# Patient Record
Sex: Female | Born: 1935 | Race: White | Hispanic: No | Marital: Married | Smoking: Never smoker
Health system: Southern US, Community
[De-identification: ages and names within clinical notes are randomized; demographics above are authoritative.]

## PROBLEM LIST (undated history)

## (undated) DIAGNOSIS — I714 Abdominal aortic aneurysm, without rupture, unspecified: Secondary | ICD-10-CM

## (undated) DIAGNOSIS — I10 Essential (primary) hypertension: Secondary | ICD-10-CM

## (undated) DIAGNOSIS — N183 Chronic kidney disease, stage 3 unspecified: Secondary | ICD-10-CM

## (undated) DIAGNOSIS — K635 Polyp of colon: Secondary | ICD-10-CM

## (undated) DIAGNOSIS — S329XXA Fracture of unspecified parts of lumbosacral spine and pelvis, initial encounter for closed fracture: Secondary | ICD-10-CM

## (undated) DIAGNOSIS — I509 Heart failure, unspecified: Secondary | ICD-10-CM

## (undated) DIAGNOSIS — M810 Age-related osteoporosis without current pathological fracture: Secondary | ICD-10-CM

## (undated) DIAGNOSIS — D539 Nutritional anemia, unspecified: Secondary | ICD-10-CM

## (undated) DIAGNOSIS — E785 Hyperlipidemia, unspecified: Secondary | ICD-10-CM

## (undated) DIAGNOSIS — J449 Chronic obstructive pulmonary disease, unspecified: Secondary | ICD-10-CM

## (undated) DIAGNOSIS — I2510 Atherosclerotic heart disease of native coronary artery without angina pectoris: Secondary | ICD-10-CM

## (undated) DIAGNOSIS — D649 Anemia, unspecified: Secondary | ICD-10-CM

## (undated) HISTORY — DX: Nutritional anemia, unspecified: D53.9

## (undated) HISTORY — PX: APPENDECTOMY: SHX54

## (undated) HISTORY — DX: Essential (primary) hypertension: I10

## (undated) HISTORY — DX: Anemia, unspecified: D64.9

## (undated) HISTORY — DX: Atherosclerotic heart disease of native coronary artery without angina pectoris: I25.10

## (undated) HISTORY — DX: Chronic obstructive pulmonary disease, unspecified: J44.9

## (undated) HISTORY — DX: Heart failure, unspecified: I50.9

## (undated) HISTORY — DX: Chronic kidney disease, stage 3 unspecified: N18.30

## (undated) HISTORY — DX: Age-related osteoporosis without current pathological fracture: M81.0

## (undated) HISTORY — DX: Hyperlipidemia, unspecified: E78.5

## (undated) HISTORY — DX: Chronic kidney disease, stage 3 (moderate): N18.3

## (undated) HISTORY — DX: Fracture of unspecified parts of lumbosacral spine and pelvis, initial encounter for closed fracture: S32.9XXA

## (undated) HISTORY — DX: Polyp of colon: K63.5

## (undated) HISTORY — DX: Abdominal aortic aneurysm, without rupture, unspecified: I71.40

## (undated) HISTORY — DX: Abdominal aortic aneurysm, without rupture: I71.4

## (undated) HISTORY — PX: OTHER SURGICAL HISTORY: SHX169

---

## 1998-09-29 ENCOUNTER — Other Ambulatory Visit: Admission: RE | Admit: 1998-09-29 | Discharge: 1998-09-29 | Payer: Self-pay | Admitting: *Deleted

## 1999-10-09 ENCOUNTER — Other Ambulatory Visit: Admission: RE | Admit: 1999-10-09 | Discharge: 1999-10-09 | Payer: Self-pay | Admitting: *Deleted

## 1999-10-17 ENCOUNTER — Encounter: Admission: RE | Admit: 1999-10-17 | Discharge: 1999-10-17 | Payer: Self-pay | Admitting: *Deleted

## 1999-10-17 ENCOUNTER — Encounter: Payer: Self-pay | Admitting: *Deleted

## 2000-01-31 ENCOUNTER — Encounter: Payer: Self-pay | Admitting: *Deleted

## 2000-01-31 ENCOUNTER — Encounter: Admission: RE | Admit: 2000-01-31 | Discharge: 2000-01-31 | Payer: Self-pay | Admitting: *Deleted

## 2001-01-07 ENCOUNTER — Encounter: Payer: Self-pay | Admitting: Emergency Medicine

## 2001-01-07 ENCOUNTER — Inpatient Hospital Stay (HOSPITAL_COMMUNITY): Admission: EM | Admit: 2001-01-07 | Discharge: 2001-01-13 | Payer: Self-pay | Admitting: Emergency Medicine

## 2001-01-07 ENCOUNTER — Encounter: Payer: Self-pay | Admitting: Orthopedic Surgery

## 2001-01-13 ENCOUNTER — Inpatient Hospital Stay (HOSPITAL_COMMUNITY)
Admission: RE | Admit: 2001-01-13 | Discharge: 2001-01-21 | Payer: Self-pay | Admitting: Physical Medicine & Rehabilitation

## 2001-02-27 ENCOUNTER — Encounter: Admission: RE | Admit: 2001-02-27 | Discharge: 2001-02-27 | Payer: Self-pay | Admitting: Internal Medicine

## 2001-02-27 ENCOUNTER — Encounter: Payer: Self-pay | Admitting: Internal Medicine

## 2001-10-30 ENCOUNTER — Encounter: Payer: Self-pay | Admitting: Internal Medicine

## 2001-10-30 ENCOUNTER — Encounter: Admission: RE | Admit: 2001-10-30 | Discharge: 2001-10-30 | Payer: Self-pay | Admitting: Internal Medicine

## 2002-07-20 ENCOUNTER — Other Ambulatory Visit: Admission: RE | Admit: 2002-07-20 | Discharge: 2002-07-20 | Payer: Self-pay | Admitting: Internal Medicine

## 2002-11-06 ENCOUNTER — Encounter: Payer: Self-pay | Admitting: Internal Medicine

## 2002-11-06 ENCOUNTER — Encounter: Admission: RE | Admit: 2002-11-06 | Discharge: 2002-11-06 | Payer: Self-pay | Admitting: Internal Medicine

## 2002-11-23 ENCOUNTER — Encounter: Payer: Self-pay | Admitting: Internal Medicine

## 2002-11-23 ENCOUNTER — Encounter: Admission: RE | Admit: 2002-11-23 | Discharge: 2002-11-23 | Payer: Self-pay | Admitting: Internal Medicine

## 2004-04-12 ENCOUNTER — Encounter: Admission: RE | Admit: 2004-04-12 | Discharge: 2004-04-12 | Payer: Self-pay | Admitting: Internal Medicine

## 2005-03-05 ENCOUNTER — Encounter: Admission: RE | Admit: 2005-03-05 | Discharge: 2005-03-05 | Payer: Self-pay | Admitting: Orthopedic Surgery

## 2005-09-17 DIAGNOSIS — I2510 Atherosclerotic heart disease of native coronary artery without angina pectoris: Secondary | ICD-10-CM

## 2005-09-17 HISTORY — DX: Atherosclerotic heart disease of native coronary artery without angina pectoris: I25.10

## 2005-12-05 ENCOUNTER — Encounter: Admission: RE | Admit: 2005-12-05 | Discharge: 2005-12-05 | Payer: Self-pay | Admitting: Internal Medicine

## 2006-01-30 ENCOUNTER — Inpatient Hospital Stay (HOSPITAL_COMMUNITY): Admission: AD | Admit: 2006-01-30 | Discharge: 2006-02-07 | Payer: Self-pay | Admitting: Orthopedic Surgery

## 2006-01-31 ENCOUNTER — Encounter (INDEPENDENT_AMBULATORY_CARE_PROVIDER_SITE_OTHER): Payer: Self-pay | Admitting: Cardiology

## 2006-10-11 ENCOUNTER — Encounter: Admission: RE | Admit: 2006-10-11 | Discharge: 2006-10-11 | Payer: Self-pay | Admitting: *Deleted

## 2007-12-11 ENCOUNTER — Inpatient Hospital Stay (HOSPITAL_COMMUNITY): Admission: EM | Admit: 2007-12-11 | Discharge: 2007-12-23 | Payer: Self-pay | Admitting: Emergency Medicine

## 2007-12-16 ENCOUNTER — Ambulatory Visit: Payer: Self-pay | Admitting: Physical Medicine & Rehabilitation

## 2007-12-22 ENCOUNTER — Ambulatory Visit: Payer: Self-pay | Admitting: Surgery

## 2007-12-22 ENCOUNTER — Encounter (INDEPENDENT_AMBULATORY_CARE_PROVIDER_SITE_OTHER): Payer: Self-pay | Admitting: Orthopedic Surgery

## 2007-12-24 ENCOUNTER — Encounter (INDEPENDENT_AMBULATORY_CARE_PROVIDER_SITE_OTHER): Payer: Self-pay | Admitting: Emergency Medicine

## 2007-12-24 ENCOUNTER — Inpatient Hospital Stay (HOSPITAL_COMMUNITY): Admission: EM | Admit: 2007-12-24 | Discharge: 2007-12-26 | Payer: Self-pay | Admitting: Emergency Medicine

## 2010-04-23 ENCOUNTER — Emergency Department (HOSPITAL_COMMUNITY): Admission: EM | Admit: 2010-04-23 | Discharge: 2010-04-23 | Payer: Self-pay | Admitting: Emergency Medicine

## 2010-09-30 ENCOUNTER — Emergency Department (HOSPITAL_COMMUNITY)
Admission: EM | Admit: 2010-09-30 | Discharge: 2010-09-30 | Payer: Self-pay | Source: Home / Self Care | Admitting: Emergency Medicine

## 2010-10-02 LAB — DIFFERENTIAL
Basophils Absolute: 0 10*3/uL (ref 0.0–0.1)
Basophils Relative: 0 % (ref 0–1)
Eosinophils Absolute: 0 10*3/uL (ref 0.0–0.7)
Eosinophils Relative: 0 % (ref 0–5)
Lymphocytes Relative: 15 % (ref 12–46)
Lymphs Abs: 1 10*3/uL (ref 0.7–4.0)
Monocytes Absolute: 0.5 10*3/uL (ref 0.1–1.0)
Monocytes Relative: 7 % (ref 3–12)
Neutro Abs: 5.2 10*3/uL (ref 1.7–7.7)
Neutrophils Relative %: 78 % — ABNORMAL HIGH (ref 43–77)

## 2010-10-02 LAB — CBC
HCT: 37 % (ref 36.0–46.0)
Hemoglobin: 11.5 g/dL — ABNORMAL LOW (ref 12.0–15.0)
MCH: 30.4 pg (ref 26.0–34.0)
MCHC: 31.1 g/dL (ref 30.0–36.0)
MCV: 97.9 fL (ref 78.0–100.0)
Platelets: 240 10*3/uL (ref 150–400)
RBC: 3.78 MIL/uL — ABNORMAL LOW (ref 3.87–5.11)
RDW: 13.4 % (ref 11.5–15.5)
WBC: 6.7 10*3/uL (ref 4.0–10.5)

## 2010-10-02 LAB — POCT I-STAT, CHEM 8
BUN: 38 mg/dL — ABNORMAL HIGH (ref 6–23)
Calcium, Ion: 1.09 mmol/L — ABNORMAL LOW (ref 1.12–1.32)
Chloride: 102 mEq/L (ref 96–112)
Creatinine, Ser: 1.8 mg/dL — ABNORMAL HIGH (ref 0.4–1.2)
Glucose, Bld: 111 mg/dL — ABNORMAL HIGH (ref 70–99)
HCT: 36 % (ref 36.0–46.0)
Hemoglobin: 12.2 g/dL (ref 12.0–15.0)
Potassium: 4 mEq/L (ref 3.5–5.1)
Sodium: 140 mEq/L (ref 135–145)
TCO2: 34 mmol/L (ref 0–100)

## 2010-10-08 ENCOUNTER — Encounter: Payer: Self-pay | Admitting: Orthopedic Surgery

## 2010-12-01 LAB — URINE CULTURE

## 2010-12-01 LAB — URINALYSIS, ROUTINE W REFLEX MICROSCOPIC
Glucose, UA: NEGATIVE mg/dL
Nitrite: NEGATIVE
Specific Gravity, Urine: 1.013 (ref 1.005–1.030)
pH: 5.5 (ref 5.0–8.0)

## 2010-12-01 LAB — URINE MICROSCOPIC-ADD ON

## 2011-01-30 NOTE — Discharge Summary (Signed)
NAME:  HAE, AHLERS NO.:  [id number]   MEDICAL RECORD NO.:  [id number]          PATIENT TYPE:  INP   LOCATION:  5150                         FACILITY:  [facility]   PHYSICIAN:  Dyke Brackett, M.D.    DATE OF BIRTH:  1917/01/05   DATE OF ADMISSION:  12/11/2007  DATE OF DISCHARGE:  12/18/2007                               DISCHARGE SUMMARY   Mrs. Bouchillon is an 77 year old female who was admitted to the hospital on  December 11, 2007, and her discharge date to a skilled nursing facility  will be on December 18, 2007.   ADMITTING DIAGNOSES:  1. Left hip pain.  2. Left hip greater trochanter fracture.   DISCHARGE DIAGNOSES:  1. Left hip pain.  2. Left hip greater trochanter fracture, status post left hip      trochanteric nailing, postoperative day #6.   SURGICAL PROCEDURE:  Status post left hip intertrochanteric nail,  postoperative day #6.  There were no particular complications.  The  patient did have some hypokalemia following the surgery, which was  addressed by the cardiologist, Dr. Donnie Aho.   CONSULTATIONS:  The only consult was to the cardiologist, Dr. Donnie Aho,  following for her cardiac history of CHF and her hypokalemia.   HISTORY OF PRESENT ILLNESS:  Mrs. Uriegas is an 77 year old female who  came into the emergency department with left hip following a fall where  she was trying to get to the bathroom, and her daughter was next door.  Fall to the ground, was unable to get up and was having severe left hip  pain.  She was evaluated in the emergency department where they  performed an x-ray and a CT scan of her hip with a diagnosis of a  trochanter fracture on the left side.  Then it was determined per MRI  scan that she did have a greater trochanter fracture on the left side of  her hip that extended down towards the lesser trochanter.  On December 12, 2007, one day after admission, she had a left intertrochanteric hip  nailing done by Dr. Madelon Lips with assistance  from Kateri Plummer, physician  assistant.  She tolerated the procedure well, and then was followed here  in the hospital.  The first postoperative day was on December 13, 2007.  Had a cardiology consult which helped control her CHF and also continued  hypokalemia.  It was noticed on the first postoperative day hemoglobin  was 10.1, her hematocrit was 29.2, white blood cells 8.4.  Potassium was  3.9.  All other laboratory data was within normal limits, except for BUN  and creatinine of 24 and 1.53, and she has a history of previous  laboratory work of renal insufficiency.  It was then noticed on  postoperative day #2 that her hemoglobin had dropped to 8 and 25 for her  hematocrit.  Therefore, it was determined the need to transfuse 2 units  of packed red blood cells, which was okayed by cardiology.  The patient  tolerated that well and it helped stabilize her hemoglobin.  On  postoperative day #  3, it was noted her hemoglobin was up to 11.6,  hematocrit 33.4, and doing well at that point on postoperative day #3,  except somewhat slow with therapy.  Her potassium was low at 3.0 and  required a total of 60 mEq of K-Dur, and continued to be followed by the  cardiologist, Dr. Donnie Aho.  She was doing well otherwise.  Vitals within  normal limits except for difficult with rehabilitation, as she only had  been at that point transferred from bed to chair on postoperative day  #3.  On postoperative day #4, continued with rehabilitation.  It was  noted that she was able to walk 10 feet with a rolling walker with  assistance.  Continued to monitor her vitals and her hemoglobin, which  stayed within normal limits, but her potassium on postoperative day #4  was 3.8.  Still slow with therapy, and trying to continue that, as the  daughter had wished to try to go home versus skilled nursing facility.  We also added in a rehabilitation consult, which was denied, as she did  not fit the proper criteria for  rehabilitation here in the hospital.  She then continued again slowly with rehabilitation, and there was  actually no progression in walking with a rolling walker and needing  significant help, and per cardiology was determined stable here in the  hospital.  As we continued adjusting through cardiology her potassium  supplement up to 20 mEq per day, on postoperative day #5 her potassium  was 3.4.  Her hemoglobin stayed stable following the transfusion at  11.5.  Her hematocrit was 33.8.  Vitals were still all within normal  limits.  On postoperative day #6, planned for transfer to the skilled  nursing facility.  Her vitals continued to stay stable and awaiting a  bed at this time.  Plan for transfer to the skilled nursing facility.   DISCHARGE INSTRUCTIONS TO THE SKILLED NURSING FACILITY:  1. Status post left greater trochanteric hip fracture, status post      intertrochanteric nail.  Can be 50-75% weightbearing on the left      lower extremity with a rolling walker.  2. If possible, obtain a BNP in the a.m. on December 19, 2007.  Continue      to watch her potassium.  3. Plan for a daily dressing change of that left hip at the skilled      nursing facility.  4. Please notify for any fever greater than 101 or any other      complications at the skilled nursing facility, to please call [facility].  5. Please have follow up in the office at [facility] two weeks following      surgery, which will be approximately on December 26, 2007.  6. The patient will also be discharged with a regular diet.   DISCHARGE MEDICATIONS:  1. Isosorbide mononitrate 60 mg p.o. daily.  2. Potassium chloride 20 mEq p.o. daily.  3. Metoprolol 50 mg p.o. b.i.d.  4. Lasix 60 mg p.o. daily.  5. Lasix 40 mg p.o. daily.  6. Simvastatin 40 mg p.o. daily.  7. Lortab 5/325 mg one-half to one tablet p.o. q.4-6h. as needed for      pain, not to exceed 4000 mg of Tylenol in 24 hours.  8. Lovenox will be continued 30 mg injected  subcutaneous through December 27, 2007 at 8 a.m. each morning.  9. Also continue calcium carbonate and vitamin D 500 mg p.o.  daily.  10.Also continue aspirin 325 mg p.o. daily.  11.All continue Robaxin 500 mg p.o. q.6-8h. as needed for pain and      muscle spasm.  12.May also continue sublingual nitroglycerin 0.4 mg on a p.r.n. basis      q5 minutes with a total of 3 maximum.      Sharol Given, PA      Dyke Brackett, M.D.  Electronically Signed    JBS/MEDQ  D:  12/18/2007  T:  12/18/2007  Job:  [id number]

## 2011-01-30 NOTE — Discharge Summary (Signed)
NAME:  Sabrina Joyce, Sabrina Joyce                 ACCOUNT NO.:  [id number]   MEDICAL RECORD NO.:  [id number]          PATIENT TYPE:  INP   LOCATION:  4702                         FACILITY:  [facility]   PHYSICIAN:  Corinna L. Lendell Caprice, MDDATE OF BIRTH:  12-02-1916   DATE OF ADMISSION:  12/24/2007  DATE OF DISCHARGE:  12/26/2007                               DISCHARGE SUMMARY   DISCHARGE DIAGNOSES:  1. Near syncope secondary to intravascular volume depletion and      probable transient hypotension..  2. Acute on chronic renal insufficiency.  3. Dementia.  4. Recent hip fracture status post nailing  5. Hypertension.  6. Coronary artery disease with history of ischemic cardiomyopathy,      compensated.  7. Hyperlipidemia.  8. Deconditioning.   DISCHARGE MEDICATIONS:  1. Imdur should be cut to 30 mg a day.  Hold for systolic blood      pressure less than 90.  2. Metoprolol should be decreased to 25 mg p.o. b.i.d.  Hold for      systolic blood pressure less than 90.  3. I recommend holding Lasix for 3 days and then resuming at 20 mg a      day, but may need adjustment depending on symptoms, fluid      retention, etc.  4. Continue simvastatin 40 mg a day.  5. Colace 100 mg p.o. b.i.d.  6. Lovenox 30 mg subcutaneously for two more days then discontinue.  7. Calcium carbonate with vitamin D 600 mg p.o. b.i.d.  8. Aspirin 325 mg daily.  9. When furosemide is resumed, resume KCl 20 mEq a day.  10.Robaxin 500 mg p.o. q.6 h p.r.n. muscle spasms.  11.Nitroglycerin 0.4 mg sublingually as needed for chest pain.  12.Tylenol 650 mg p.o. q.4 h p.r.n. pain.   FOLLOWUP:  She will need a follow-up appointment with Dr. Madelon Lips next  week.  Please call [phone] for appointment and her staples have been  removed per or those recommendations.  She may follow up with Dr. Sandria Manly  as previously recommended by orthopedics.  Call [phone] for  appointment.   DISCHARGE INSTRUCTIONS:  1. Activity:  Continue physical  therapy, occupational therapy, 50-75%      partial weightbearing to the left leg with walker.  2. Code status:  Full code.  3. Diet:  Should be low-salt.   CONDITION:  Stable.   CONSULTATIONS:  None.   PROCEDURES:  None.   PERTINENT LABORATORY DATA:  CBC was significant for a hemoglobin of  11.6, hematocrit 34, otherwise unremarkable, D-dimer 3.13, PTT 38, INR  1.0.  On admission, her BUN was 54, creatinine was 1.9.  The day prior,  her BUN was 47 and her creatinine was 1.6.  At discharge, her BUN is 33,  creatinine 1.58, magnesium 2.6, potassium was normal.  Point care  enzymes and cardiac enzymes negative.  Urinalysis negative.  Myoglobin  normal.   SPECIAL STUDIES/RADIOLOGY:  EKG showed normal sinus rhythm with lateral  ST depression which remained unchanged.  Chest x-ray showed nothing  acute.  VQ scan was intermediate probability, but favor low end  of  intermediate range given the amount of gas trapping Dopplers of the legs  were negative.   HISTORY AND HOSPITAL COURSE:  Ms. Mars is an 77 year old white female  patient of [facility] who had been discharged the day prior  to this admission.  She had been treated prior to admission for hip  fracture and subsequent nailing.  She was transferred to [facility]  skilled nursing facility for rehab and the day after discharge had a  presyncopal episode where she was sitting down and nodded out.  She  never completely lost consciousness.  Her blood pressure was borderline.  When she arrived in the emergency room, she had normal oxygen  saturation.  She had no complaints of chest pain, no shortness of  breath.  Her blood pressure was 121/69.  On physical examination, when  the patient sat up from a supine position she had recurrence of her near  syncope, and it was felt secondary to intravascular volume depletion due  to the reproducibility of the symptoms, and the fact that her BUN and  creatinine were elevated above  her baseline.  She was admitted and given  gentle IV hydration.  Her Imdur and furosemide and metoprolol were held  and should be started back at a lower dose.  These can be up titrated as  tolerated.  At the time of discharge, she was feeling much better, was  able to ambulate with assistance to commode.  Her creatinine was  improved.  Her blood pressure was stable.  Her other medical problems  remained stable during her hospitalization.  Total time on the day of  discharge is 50 minutes.      Corinna L. Lendell Caprice, MD  Electronically Signed     CLS/MEDQ  D:  12/26/2007  T:  12/26/2007  Job:  [id number]   cc:   Dyke Brackett, M.D.  Georga Hacking, M.D.  Lavonda Jumbo, M.D.

## 2011-01-30 NOTE — Discharge Summary (Signed)
NAME:  FRANKIE, ZITO NO.:  [id number]   MEDICAL RECORD NO.:  [id number]          PATIENT TYPE:  INP   LOCATION:  5150                         FACILITY:  [facility]   PHYSICIAN:  Dyke Brackett, M.D.    DATE OF BIRTH:  Jan 16, 1917   DATE OF ADMISSION:  12/11/2007  DATE OF DISCHARGE:                               DISCHARGE SUMMARY   DISCHARGE SUMMARY ADDENDUM:  Mrs. Idris has now finally been placed at  [facility]. Plan is to discharge there.   HOSPITAL COURSE:  From post-op day #6 on went forward with no  significant changes or any significant lab work abnormalities as CBC and  BNP were continued to be followed, and all were within normal limits.  Continued to do well with some mild progression following her left  trochanteric nailing of her left greater trochanter hip fracture. Her  vitals all stayed within normal limits. Post-op day #10 she was  continued to be encouraged to walk more. Her BNP, potassium was noted to  be 3.6 within normal range as earlier she had issues with hypokalemia.  Her vitals were all within normal limits. Encouraged walking more with  PT and ended up walking a total of 15 feet with a rolling walker but  continued to need lots of queues to help her out with walking and being  partial weightbearing, that 50-75%. Also did a Doppler ultrasound of the  left upper extremity where the preliminary report said negative for DVT  or superficial thrombosis. Therefore, it is believed that the small area  of abnormality is just a hematoma under the skin in her left forearm. So  on post-op day #11 status post her left hip trochanteric nail she was  finally put in bed available at [facility] and  therefore discharged on post-op day #11. There for continued therapy due  to difficulty with daily living activities and ambulation.   DISCHARGE MEDICATIONS:  1. Isosorbide mononitrate 60 mg 1 tablet p.o. q day.  2. Metoprolol 50 mg 1 tablet p.o. b.i.d.  3. Furosemide __________ 60 mg p.o. q day.  4. Furosemide __________ 40 mg p.o. q day.  5. Simvastatin 40 mg p.o. q day.  6. Colace 100 mg p.o. b.i.d.  7. Lovenox 30 mg subcu 24 hours will be continued for a total of      another 4 days and then will discontinue the subcu Lovenox.  8. Calcium carbonate.  9. Vitamin D may be continued.  10.Aspirin 325 mg p.o. daily may be continued.  11.The patient is also getting 30 meq of K-Dur p.o. q day, continue to      help control hypokalemia.  12.Robaxin 500 mg p.o. q 6 hours as needed for spasms.  13.Nitroglycerin 0.4 mg sublingual q 5 minutes as needed for chest      pain and shortness of breath.  14.Hydrocodone 5 mg/325 mg 1/2 to 1 tablet p.o. q 4-6 hours as needed      for pain and discomfort.   ASSESSMENT AND PLAN:  On discharge, the patient is  following a left  trochanteric nailing on post-op day 11. She will continue partial  weightbearing with a walker, 50-75% at skilled nursing facility. Her  diet is regular. She will need a recheck in the office early next week  preferably on December 29, 2007. Please call for appointment. She will  require at that point a recheck. We would prefer it if possible in the  skilled nursing facility to have staples removed on post-op day 14 which  will be December 26, 2007. Please call the office at [facility] for appointment  with Dr. Madelon Lips at  [phone] for sometime during the week of December 29, 2007. Also recommend  on  discharge due to abnormal CT scan, follow up with a neurologist.  Recommend calling for appointment to review CT scan at the number [phone], Dr. Avie Echevaria, who is on call today for neurology. She needs a  followup to review CT scan.      Sharol Given, PA      Dyke Brackett, M.D.  Electronically Signed    JBS/MEDQ  D:  12/23/2007  T:  12/23/2007  Job:  [id number]

## 2011-01-30 NOTE — H&P (Signed)
NAME:  Sabrina, Joyce                 ACCOUNT NO.:  [id number]   MEDICAL RECORD NO.:  [id number]          PATIENT TYPE:  INP   LOCATION:  4702                         FACILITY:  [facility]   PHYSICIAN:  Corinna L. Lendell Caprice, MDDATE OF BIRTH:  [birth year]   DATE OF ADMISSION:  12/24/2007  DATE OF DISCHARGE:                              HISTORY & PHYSICAL   CHIEF COMPLAINT:  Almost passed out.   HISTORY OF PRESENT ILLNESS:  [name] is an 78 year old demented white  female who was brought to the emergency room from [facility] after a near-syncopal episode.  She was reportedly  sitting and apparently her head slumped over and then rolled back, and  she was moaning.  She reportedly said over and over, I'm sick.  She  cannot recall the specifics of the episode.  She has no shortness of  breath or chest pain currently, and her blood pressure was reportedly  92/44 with a CBG of 214, pulse 94, and respiratory rate 24.  Her oxygen  saturation was reportedly 77% and then increased to 88% on 2 liters  nasal cannula.  The patient was discharged from the hospital yesterday  from Dr. Candise Bowens service.  She had an intertrochanteric hip fracture  and had nailing on December 12, 2007.  She had been on Lovenox at  [facility] for DVT prophylaxis.  She has no history of DVT or  pulmonary embolus.  Her saturations have been normal as well as her  blood pressure here in the emergency room.   PAST MEDICAL HISTORY:  As above.  Also, history of myocardial infarction  with medical management, ischemic cardiomyopathy with an ejection  fraction of 40-45%, mild-to-moderate mitral valvular regurgitation,  hyperlipidemia, hypertension, and chronic renal insufficiency.   MEDICATIONS:  Reviewed and as per yesterday's discharge summary.   SOCIAL HISTORY:  Reviewed and as per previous.   FAMILY HISTORY:  Reviewed and as per previous.   REVIEW OF SYSTEMS:  Difficult due to dementia, but  otherwise negative  other than above.   PHYSICAL EXAMINATION:  VITAL SIGNS:  Temperature is 97.6, blood pressure  121/69, pulse 71, respiratory rate 18, and oxygen saturation 95% on room  air.  GENERAL:  The patient is an elderly white female in no acute distress.  HEENT:  Normocephalic, atraumatic.  Pupils equal, round, and reactive to  light.  Sclerae are nonicteric.  She has slightly dry mucous membranes.  NECK:  Supple.  No carotid bruits.  LUNGS:  Clear to auscultation bilaterally, but when the patient sat up,  she became dizzy and had to lie down immediately.  CARDIOVASCULAR:  Regular rate and rhythm with a systolic murmur in the  mitral area.  ABDOMEN:  Soft, nontender.  GU:  Deferred.  RECTAL:  Deferred.  EXTREMITIES:  No calf tenderness.  No edema.  Pulses are intact.  Denna Haggard' sign negative.  NEUROLOGIC:  The patient is alert and oriented to person and place.  Cranial nerves and sensory motor exam are grossly intact.  PSYCHIATRIC:  The patient is calm and cooperative with a normal affect.  SKIN:  No rash.   LABORATORY DATA:  CBC is significant for a hemoglobin of 11.6,  hematocrit 34, otherwise unremarkable.  PTT is 38.  D-dimer is 3.13.  Basic metabolic panel is significant for a glucose of 133.  BUN is 54,  yesterday it was 47.  Creatinine today is 1.9; yesterday, it was 1.6.  Point-of-care enzymes negative.  Urinalysis negative.  EKG shows normal  sinus rhythm with some ST depression in the lateral leads.  Chest x-ray  shows nothing acute.  VQ scan showed intermediate probability study for  the presence of pulmonary embolism; however, probability of PE is at the  lower end of intermediate range given the amount of gas trapping.   ASSESSMENT AND PLAN:  1. Near syncope.  Certainly with her hypoxia and recent surgery, PE is      a concern, and I will start Lovenox.  I will also order Dopplers of      the legs.  However, given her borderline blood pressure, her       elevated BUN and creatinine as compared to usual and the fact that      she became symptomatic when sitting up again, I suspect this was      related to intravascular volume depletion and transient      hypotension.  She will get IV fluids.  She will remain on      telemetry.  I will rule out MI.  Check another EKG in the morning.      Certainly, she has no chest pain, is not short of breath now, and      has no hypoxia.  2. Dementia.  The patient is a full code per daughter.  3. Recent hip fracture status post nailing.  4. Hypertension.  5. Coronary artery disease with history of ischemic cardiomyopathy and      congestive heart failure.  The patient's heart failure is      compensated currently.  6. Hyperlipidemia.  7. Deconditioning.  8. Acute on chronic renal insufficiency.  Her antihypertensives and      cardiac medications will be held.      Corinna L. Lendell Caprice, MD  Electronically Signed     CLS/MEDQ  D:  12/24/2007  T:  12/25/2007  Job:  [id number]   cc:   Lavonda Jumbo, M.D.  Georga Hacking, M.D.  Dyke Brackett, M.D.

## 2011-01-30 NOTE — Op Note (Signed)
NAME:  Sabrina Joyce, Sabrina Joyce                 ACCOUNT NO.:  [id number]   MEDICAL RECORD NO.:  [id number]          PATIENT TYPE:  INP   LOCATION:  5150                         FACILITY:  [facility]   PHYSICIAN:  Dyke Brackett, M.D.    DATE OF BIRTH:  05/18/1917   DATE OF PROCEDURE:  12/12/2007  DATE OF DISCHARGE:                               OPERATIVE REPORT   PREOPERATIVE DIAGNOSIS:  Left nondisplaced intertrochanteric hip  fracture.   POSTOPERATIVE DIAGNOSIS:  Left nondisplaced intertrochanteric hip  fracture.   OPERATION:  Left intertrochanteric hip nailing (DePuy Ace 125 degrees  180 x 9 mm trochanteric nail with 95 x 10.5 mm lag screw).   SURGEON:  Caffrey.   ASSISTANT:  Governor Specking, PA.   BLOOD LOSS:  Approximately 150.   PROCEDURE:  Sterile prep and drape with the fracture table leg slightly  adducted in the supine position.  C-arm showed an entry point just  superior to the trochanter where a guide pinned was placed without  difficulty, overreamed with the appropriate reamer.  This was followed  by insertion of the nail through the greater trochanter area with the  appropriate lag screw length as well as head shaft angle, confirmed to  be in good position without any screws in the AP and lateral plane,  followed by insertion of the lag screw.  No compression needed to the  fact it was nondisplaced at least in the intertrochanteric area, and 2  distal lag screw placed confirmed on x-ray to be with anatomic  reduction, as well as placement of the screws.  The proximal incision  which was through a gluteus maximus split was closed with running  interrupted Vicryl skin clips and skin clips, and then the insertion  site for the compression screw was closed with Vicryl and staples, in  staples on the femoral screws side.  The wounds were irrigated.  Marcaine without epinephrine was instilled in the skin.  A lightly  compressive sterile dressing applied.  Taken to recovery in stable  addition.      Dyke Brackett, M.D.  Electronically Signed     WDC/MEDQ  D:  12/12/2007  T:  12/13/2007  Job:  [id number]

## 2011-02-02 NOTE — Consult Note (Signed)
[place]. [facility]  Patient:    Sabrina Joyce, Sabrina Joyce                        MRN: [id number] Proc. Date: 01/07/01 Adm. Date:  [id number] Attending:  Twana First CC:         Darius Bump, M.D.  Robert A. Thurston Hole, M.D.   Consultation Report  REASON FOR CONSULTATION:  Thank you for asking me to see this 77 year old female for cardiologic opinion regarding preoperative evaluation. She has a longstanding history of essential hypertension and also has hyperlipidemia under treatment. She has been very active physically, able to do yard work and has really been asymptomatic. The patient does note some mild dyspnea walking up hills or stairs, but this is nothing new according to her. She specifically denies chest pain suggestive of angina, tightness, pressure, heaviness, PND, orthopnea, TIAs or claudication.  She sustained a hip fracture when she had a cramp in her leg last evening and was walking around and lost her balance and fell. There was no syncope.  PAST MEDICAL HISTORY: 1. Hypertension for many years. 2. Hyperlipidemia for years. 3. History of peripheral vascular disease with bruit and an    abdominal aortic aneurysm.  PAST SURGICAL HISTORY: 1. Appendectomy. 2. Cataract extraction.  CURRENT MEDICATIONS: 1. Toprol XL 50 mg daily. 2. Zocor 10 mg daily. 3. K-Dur 20 mEq daily. 4. Norvasc 5 mg daily. 5. Hydrochlorothiazide 25 mg daily. 6. Aspirin daily.  ALLERGIES:  No known drug allergies.  FAMILY HISTORY:  Positive for heart disease. Father died at age 51 of MI and brother died at age 20 of colon cancer, a brother died at age 78 of uncertain cause, a brother died of peripheral vascular disease and a sister died of renal failure.  SOCIAL HISTORY:  She is married. Her husband had bypass grafting approximately 6 weeks ago. She has a daughter who is in town with her. She does not use alcohol or tobacco products.  PHYSICAL  EXAMINATION:  GENERAL:  She is a pleasant elderly woman who is laying in bed in traction.  VITAL SIGNS:  Blood pressure elevated at 160/105, pulse is currently 100.  SKIN:  Warm and dry.  HEENT:  There are no carotid bruit noted. There is no JVD noted. Pupils equal, round and reactive to light and accommodation bilaterally. Fundi was not examined.  LYMPHATICS:  Lymph nodes unremarkable.  LUNGS:  Clear.  CARDIOVASCULAR:  Normal S1 and S2. There was no S3 and no murmur.  ABDOMEN:  Soft and nontender. There is no aneurysm palpable. There are bilateral femoral bruit present.  EXTREMITIES:  Dorsalis pedis pulses are 2+. There is no significant edema noted. Her right leg is immobilized.  NEUROLOGIC:  Grossly normal.  LABORATORY AND ACCESSORY DATA:  12-Lead ECG shows ST depression in inferolateral leads, new according to previous ECG.  Lab studies show a BUN of 26.0, creatinine 1.3, potassium 3.4. Hemoglobin 12.3, hematocrit 36.2, glucose 149.  IMPRESSION: 1. Hip fracture in need of surgery. I believe you may proceed from a    cardiovascular view point. 2. Abnormal electrocardiogram. Differential possibility would include    due to hypertension, hypokalemia or ischemia. She has no clinical    evidence of ischemia. 3. Longstanding hypertensive heart disease. 4. Hyperlipidemia under treatment. 5. Peripheral vascular disease with an abdominal aortic aneurysm and    bilateral femoral bruit.  RECOMMENDATIONS:  You may proceed with surgery from a cardiovascular  viewpoint. I would try to replete her potassium to get it above 4.0 and do EKG monitoring following surgery. I would go ahead and cover her with beta blockers during the time of surgery like she has been on before.  I appreciate seeing this nice woman with you.  Sincerely yours. DD:  01/07/01 TD:  01/07/01 Job: [id number]

## 2011-02-02 NOTE — Discharge Summary (Signed)
NAMEBLAKELYNN, SCHEELER                 ACCOUNT NO.:  [id number]   MEDICAL RECORD NO.:  [id number]          PATIENT TYPE:  INP   LOCATION:  4728                         FACILITY:  [facility]   PHYSICIAN:  Hollice Espy, M.D.DATE OF BIRTH:  12-01-1916   DATE OF ADMISSION:  01/30/2006  DATE OF DISCHARGE:  02/07/2006                                 DISCHARGE SUMMARY   ADDENDUM:   ATTENDING PHYSICIAN:  Osvaldo Shipper, M.D.   CONSULTANTSGeorga Hacking, M.D., cardiology.   ADDENDUM:  Please see previous discharge summary by Corinna L. Lendell Caprice,  M.D., from May 16-21, 2007.  Additional addendum is as follows:   ADDITIONAL DIAGNOSES:  1.  Nonsustained episodes of ventricular tachycardia.  2.  Renal insufficiency, baseline creatinine of 1.1.   DISCHARGE MEDICATIONS FOR THIS PATIENT:  1.  Zocor 10 mg p.o. q.h.s.  2.  Imdur 30 mg p.o. daily.  3.  Colace 100 mg p.o. b.i.d.  4.  K-Dur 20 mEq p.o. daily.  5.  Dulcolax 10 mg p.o. daily.  6.  Lisinopril 10 mg p.o. b.i.d.  7.  Metoprolol 50 mg p.o. b.i.d.  8.  Aspirin 81 mg p.o. daily.  9.  Lasix 40 mg p.o. daily.  10. Plavix 75 mg p.o. daily.   [facility] HOSPITAL COURSE FROM MAY 21-24, 2007:  The patient remained well.  She was complaining of some weakness.  She had episodes of nonsustained  ventricular tachycardia on the night of May 21 and again on May 22.  In  discussion with Dr. Donnie Aho of cardiology, who had been following the  patient, given patient's advanced age, some CHF left ventricular function,  Dr. Donnie Aho also recommended giving patient potassium intact, following her  BNP, adding Plavix, and in discussion with the family and the patient, they  are opposed to a CABG and actually would not plan for invasive treatment on  this patient and recommended instead medical management only.  Given  these findings, I concur.  Plan will be, barring any other further episodes  or occurrences, the patient will be discharged to [facility]  on Feb 07, 2006.  The patient's overall disposition is improved.  She will  be discharged on a heart-healthy diet.      Hollice Espy, M.D.  Electronically Signed     SKK/MEDQ  D:  02/06/2006  T:  02/06/2006  Job:  [id number]

## 2011-02-02 NOTE — Discharge Summary (Signed)
NAME:  BESAN, KETCHEM                 ACCOUNT NO.:  [id number]   MEDICAL RECORD NO.:  [id number]          PATIENT TYPE:  INP   LOCATION:  4728                         FACILITY:  [facility]   PHYSICIAN:  Corinna L. Lendell Caprice, MDDATE OF BIRTH:  1917-06-22   DATE OF ADMISSION:  01/30/2006  DATE OF DISCHARGE:                                 DISCHARGE SUMMARY   This is a preliminary discharge summary on Ms. Lich.  An addendum will  need to be dictated.   DISCHARGE DIAGNOSES:  1.  Acute non-Q-wave myocardial infarction.  2.  Congestive heart failure, ejection fraction 40-45% by echocardiogram.  3.  Mild to moderate mitral valvular regurgitation.  4.  Status post fall with sacral and alar fracture right ileopubic eminence      and right lateral inferior pubic ramus fracture and left medial superior      pubic ramus fractures and partial thickness tear of the left hamstring      at the ischial tuberosity insertion.  5.  Hypertension.  6.  Hyperlipidemia.  7.  History of right hip fracture.  8.  Probable mild dementia.   MEDICATIONS:  Per discharging physician.   CONSULTATIONS:  Cardiology.   PROCEDURES:  None.   CONDITION:  Stable.   ACTIVITY:  As tolerated.   PERTINENT LABORATORIES:  TSH 3.199, LDL 54, HDL 55, total cholesterol 161,  triglycerides 69.  BNP on admission was 1812, on May 21 was 1200.  Peak  troponin was 11.3.  Peak CPK was 393.  Peak MB fraction was 42 with a  relative index of 10.  Basic metabolic panel on admission was unremarkable.  ABG on room air:  Her pH was 7.44, pCO234, pO2 62.  D-dimer 1.12.  EKG  showed normal sinus rhythm with marked ST depressions laterally.  Chest x-  ray showed cardiac enlargement without acute pulmonary process.   HISTORY AND HOSPITAL COURSE:  Ms. Kirstein is an 77 year old white female who  was originally admitted to Dr. Candise Bowens service for hip fractures.  She had  fallen several weeks prior to admission and had negative plain films.   She  did have an eventual outpatient MRI which showed the above fractures.  Her  primary care physician is Dr. Daphine Deutscher.  Apparently en route to the hospital  in the ambulance she was hypoxic down into the 80s and she was complaining  of shortness of breath and orthopnea.  She denied any chest pain.  I was  consulted for the dyspnea.  An EKG did show lateral ischemia and she ruled  in for myocardial infarction by enzyme.  Cardiology was consulted and felt  that medical management would be most appropriate in this situation.  The  patient had no rales on exam.  She was alert and oriented.  Please see H&P  for complete details.  She was given Lasix, aspirin, Lovenox, beta blocker.  She has been started on an ACE inhibitor as well.  Her shortness of breath  improved.  She had no arrhythmia.  She had several days of bed rest and at  present may  be discharged in a day or two if stable.   She has had physical therapy and at this time disposition is pending.  She  had been living at home and I suspect skilled nursing facility is most  prudent unless she can have 24-hour supervision.  If she does end up going  home, she will need home physical therapy.   During her hospitalization she did have some constipation and will need a  good bowel regimen as an outpatient.   She also had some sundowning but was not agitated and did not require any  sedation to date.   I did discuss code status with the daughter who said she would discuss with  her mother but as of yet no decisions have been made.  She has no Advanced  Directive.  Further followup, disposition, and medications to be dictated by  discharging physician.      Corinna L. Lendell Caprice, MD  Electronically Signed     CLS/MEDQ  D:  02/04/2006  T:  02/04/2006  Job:  [id number]

## 2011-02-02 NOTE — Consult Note (Signed)
NAME:  Sabrina Joyce, Sabrina Joyce                 ACCOUNT NO.:  [id number]   MEDICAL RECORD NO.:  [id number]          PATIENT TYPE:  INP   LOCATION:  4728                         FACILITY:  [facility]   PHYSICIAN:  Corinna L. Lendell Caprice, MDDATE OF BIRTH:  [birth year]   DATE OF CONSULTATION:  DATE OF DISCHARGE:                                   CONSULTATION   REQUESTING PHYSICIAN:  Dr. Madelon Lips   REASON FOR CONSULTATION:  Dyspnea.   Please note that I examined and interviewed the patient yesterday but no  tests were back, so much of the events have occurred overnight.   IMPRESSIONS/RECOMMENDATIONS:  1.  Acute myocardial infarction, non-Q-wave, by troponin and cardiac      enzymes:  I recommend Lovenox, aspirin, oxygen, beta blocker,      echocardiogram.  Dr. Verdis Prime has been consulted by my partner, Dr.      Rito Ehrlich, overnight.  [facility] is to see the patient this      morning.  For now I will hold on the anticoagulant and antiplatelet      medications in case she goes to cardiac catheterization.  I will,      however, give beta blocker.  They may do a Myoview but I doubt she can      undergo stress Myoview, even chemical, due to the positive enzymes.  I      will, however, discuss further with cardiology.  A repeat EKG is      pending.  I will also check a TSH to rule out hyperthyroid state.  The      patient can be transferred to my service.  2.  Dyspnea, reported hypoxia:  The patient reports having had orthopnea      over the past week.  She has no sign of congestive heart failure on exam      or chest x-ray, but she may very well have an element of heart failure      and I will check a BNP in addition to the echocardiogram.  She also has      a slightly elevated D-dimer and is at risk for pulmonary embolus.  She      may also need a CT angiogram of the chest.  I will hold on this for now,      however.  3.  Sacral and alar fracture, right ileopubic imminence, right lateral  inferior pubic ramus and left medial superior pubic ramus fractures.  4.  Partial thickness tear of the left hamstring at the ischial tuberosity      insertion:  For now the patient will be bedrest but further      recommendations per orthopedics once the patient's medical status is      stabilized.  5.  History of hypertension.  6.  History of hyperlipidemia.  7.  History of right hip fracture.  8.  Appendectomy.   HISTORY OF PRESENT ILLNESS:  Ms. Battey is a pleasant 77 year old white  female patient who was admitted to Dr. Candise Bowens service with pelvic  fractures.  She reportedly had desaturations in  the ambulance on the way to  the hospital into the 80s and was complaining of shortness of breath.  She  denies any chest pain.  Her oxygen saturation is now normal.  She does  report orthopnea, worsening over the past few days.  She denies any calf  pain.  She has chronic leg swelling which is actually improved since she has  been on bedrest.  She fell about a month ago and apparently has had plain  films which showed no fracture but had an MRI as an outpatient which did  show pelvic fractures as above, and she was admitted for this problem.  I  was asked to assist with the hypoxia and dyspnea.  I saw the patient  yesterday about half an hour after the consult.  It was about 6:30 when I  saw the patient.  She had none of her tests back yet, but overnight she has  ruled in for myocardial infarction and the above events took place.  She has  no previous history of coronary artery disease or other cardiac issues that  she knows of.  She has no history of thromboembolus.  She has been  immobilized due to this recent fall and resulting pelvic fractures.   PAST MEDICAL HISTORY:  As above.   MEDICATIONS AT HOME:  1.  She takes Vicodin as needed.  2.  Simvastatin 10 mg a day.  3.  Aspirin 325 mg a day.  4.  Norvasc 5 mg a day.   She reports an intolerance to CODEINE.   SOCIAL HISTORY:   She has no smoking or drinking history.  She is here with  her daughter.   FAMILY HISTORY:  Noncontributory.   REVIEW OF SYSTEMS:  As above, otherwise negative.   PHYSICAL EXAMINATION:  VITAL SIGNS:  Her temperature is 97, pulse 80,  respiratory rate 16, blood pressure 125/72, oxygen saturation 98% on 2 L  nasal cannula oxygen.  GENERAL:  The patient is a thin, elderly white female in no acute distress.  HEENT:  Normocephalic, atraumatic.  Pupils equal, round, reactive to light.  Sclera nonicteric.  Moist mucous membranes.  NECK:  Supple.  No JVD, no thyromegaly.  LUNGS:  Clear to auscultation bilaterally without wheezes, rhonchi or rales.  CARDIOVASCULAR:  Quiet heart tones.  No murmurs, gallops or rubs  appreciated.  ABDOMEN:  Soft, nontender, nondistended.  GENITOURINARY AND RECTAL:  Deferred.  EXTREMITIES:  She has wrinkled skin around her legs and ankles consistent  with decreased edema.  She has no calf tenderness.  No cords.  Homans sign  negative.  SKIN:  No rash.  PSYCHIATRIC:  Normal affect.  NEUROLOGIC:  The patient is alert and oriented.  Cranial nerves and  sensorimotor exam are grossly intact.   LABORATORY:  Her ABG on room air shows pH of 7.44, pCO2 of 34, pO2 of 62,  bicarbonate 23, base deficit 0.5, oxygen saturation 91.9%.  D-dimer 1.12.  BMET unremarkable.  Initial CPK was 398, MB fraction was 42, index was 10.  Initial troponin was 8.85 and at 2:00 a.m. is 11.3.  EKG done yesterday  shows lateral ischemia.  She has an old EKG which I have found since  yesterday and on January 07, 2001, had actually worsening of the inferolateral  ischemia and ST depression.  The subsequent day she had normalization of  those changes.  Chest x-ray shows nothing acute.   ASSESSMENT/PLAN:  As above.      Corinna L. Lendell Caprice,  MD  Electronically Signed     CLS/MEDQ  D:  01/31/2006  T:  01/31/2006  Job:  [id number]  cc:   Darius Bump, M.D.  Fax: [phone]   Dyke Brackett, M.D.  Fax: [phone]

## 2011-02-02 NOTE — Consult Note (Signed)
Sabrina Joyce, Sabrina Joyce NO.:  [id number]   MEDICAL RECORD NO.:  [id number]          PATIENT TYPE:  INP   LOCATION:  4728                         FACILITY:  [facility]   PHYSICIAN:  Lyn Records, M.D.   DATE OF BIRTH:  1916/12/26   DATE OF CONSULTATION:  01/31/2006  DATE OF DISCHARGE:                                   CONSULTATION   REASON FOR SERVICE:  Abnormal cardiac enzymes and abnormal EKG.   CONCLUSIONS:  1.  Acute coronary syndrome/non ST elevations myocardial infarction, age      undetermined but fresh within the last 48 hours.  2.  Prior history of hypertension.  3.  Abdominal aortic aneurysm small when last evaluated in 2001.  4.  Altered mental status/early dementia.  5.  Pelvic fractures.   RECOMMENDATIONS:  1.  Empiric therapy with Imdur/long-acting nitrates.  2.  Start beta blocker therapy in the form of Toprol-XL 25 mg per day.  3.  Sublingual nitroglycerin if recurrent chest discomfort.  4.  2-D echocardiogram to assess left ventricular function.  5.  Further evaluation where the invasive investigation or not after      discussion with family.  6.  Obvious continuation of aspirin therapy and Lovenox for the time being.   COMMENTS:  The patient is 76 and was admitted to the hospital for pain  control after being documented to have pelvic fractures by the ortho  service.  She complained of having some shortness of breath and had cardiac  enzymes evaluated.  These were elevated.  An EKG was done and this  demonstrated anterolateral T wave inversion.  The patient has no known prior  cardiac history but did have similar EKG changes prior to hip fracture  surgery in 2001 and was seen in consultation at that time by Dr. Donnie Aho.  She was cleared for surgery and apparently did well with the surgery from a  cardiac standpoint.   ALLERGIES:  CODEINE.   CURRENT MEDICATIONS:  Norvasc, coated aspirin, Zocor, Lovenox.   HABITS:  Denies ethanol and tobacco  use   SIGNIFICANT PAST MEDICAL HISTORY:  Outlined above.   FAMILY HISTORY:  Noncontributory.   PHYSICAL EXAMINATION:  GENERAL:  The patient is lying flat in bed.  She does  not complain of dyspnea.  VITAL SIGNS:  Blood pressure 120/72, heart rate __________.  NECK:  Neck veins are distended.  Left carotid bruit is heard.  CARDIAC:  Scratchy 1-2/6 systolic murmur, right upper sternal border.  No  diastolic murmurs heard.  LUNGS:  Clear.  ABDOMEN:  Soft.  Bowel sounds normal.  EXTREMITIES:  No edema.  NEUROLOGIC:  Unremarkable.   LABORATORY DATA:  The EKG done on May 17 demonstrates anterolateral T wave  and ST abnormalities and evidence of small inferior Q waves. The EKG  appearance this morning is somewhat similar to those obtained in 2001.  Chest x-ray has cardiac enlargement.  No acute abnormality noted.  Troponin  and CK-MBs are all elevated x3 with the troponin peaking at 11.33 and the  one supplement to that being 7.95.  BUN and creatinine are normal at 14 and  1.   DISCUSSION:  The patient has coronary disease and has had a non ST elevation  myocardial infarction with still positive enzymes.  She is asymptomatic at  this time.  The difficult situation here is the patient's age and her  overall mental status.  Discussions with family will be necessary to  determine the correct approach.  Right now would seem prudent to go with  empiric and ischemic therapy.  Perhaps evaluation of left ventricular  function would also be helpful by echocardiography.  We will contact Dr.  Donnie Aho who has seen her previously as a cardiologist.  He will follow up.      Lyn Records, M.D.  Electronically Signed     HWS/MEDQ  D:  01/31/2006  T:  02/01/2006  Job:  [id number]   cc:   Darius Bump, M.D.  Fax: [phone]   W. Viann Fish, M.D.  Fax: [phone]  Email: [email or web address]

## 2011-02-02 NOTE — Op Note (Signed)
[place]. [facility]  Patient:    Sabrina Joyce, Sabrina Joyce                        MRN: [id number] Proc. Date: 01/07/01 Adm. Date:  [id number] Attending:  Twana First                           Operative Report  PREOPERATIVE DIAGNOSIS:  Displaced right intertrochanteric hip fracture.  POSTOPERATIVE DIAGNOSIS:  Displaced right intertrochanteric hip fracture.  OPERATION PERFORMED:  Open reduction internal fixation right intertrochanteric hip fracture with 5-hole [employer] hip screw.  SURGEON:  Sharlot Gowda., M.D.  ASSISTANT:  Arnoldo Morale, P.A.  ESTIMATED BLOOD LOSS:  Approximately 350.  ANESTHESIA:  DESCRIPTION OF PROCEDURE:  Sterile prep and drape.  Reduction of fracture with longitudinal traction, slight abduction, internal rotation of the leg. Confirmation with fluoroscopy of reduction three-part intertrochanteric hip fracture, longitudinal incision laterally based ____________ iliotibial band, vastus lateralis, identification of vastus ridge.  Insertion of guide pin 135 degree hip screw well within the anatomic center of the head on the lateral and close to the anatomic center on the AP view of the hip.  Placement of the guide pin up with placement of a cannulated step reamer to fit the 90 degree lag screw.  Lag screws inserted followed by 4-hole plate, fixed with standard technique.  Bone quality even though given the patients age in her 41s, was reasonably good.  Confirmed to be in good reduction, AP and lateral plane followed by insertion of the axial compression screw with more compression of the fracture with fluoroscopy.  Again, stable reduction confirmed. Irrigation.  Closure with 0, #1 Vicryl, 2-0 Vicryl, skin clips.  Marcaine with epinephrine in skin.  Lightly compressive sterile dressing applied.  Taken to recovery room good condition. DD:  01/07/01 TD:  01/08/01 Job: [id number]

## 2011-02-02 NOTE — Discharge Summary (Signed)
[place]  Patient:    JAMYRAH, SAUR                        MRN: [id number] Adm. Date:  [id number] Disc. Date: [id number] Attending:  Herold Harms Dictator:   Arnoldo Morale, P.A. CC:         Lum Babe, M.D.   Discharge Summary  ADMISSION DIAGNOSES: 1. Right intertrochanteric femur fracture. 2. Hypertension. 3. Hypercholesterolemia. 4. Hypokalemia.  DISCHARGE DIAGNOSES: 1. Posthemorrhagic anemia. 2. Right intertrochanteric femur fracture. 3. Hypertension. 4. Hypercholesterolemia. 5. Hypokalemia.  PROCEDURE:  On January 07, 2001, Ms. Leja underwent a compression screw fixation of her right intertrochanteric femur fracture by W. Su Ley., M.D. and assisted by Arnoldo Morale, P.A.  COMPLICATIONS:  None.  CONSULTING PHYSICIANS: 1. Internal medicine consult was obtained on January 07, 2001, by Dr. Delrae Alfred. 2. Pharmacy consult for Coumadin therapy on January 07, 2001. 3. Cardiology consult by Dr. Donnie Aho on January 07, 2001. 4. Physical therapy consult on January 08, 2001. 5. Rehabilitation medicine consult on January 10, 2001.  HISTORY OF PRESENT ILLNESS:  This 77 year old white female presented to the emergency department after falling at home the night prior to admission.  She reports she got a leg cramp in her leg and got up to help relieve it and as she was hopping around to relieve it, she fell and landed on her right hip. She was found in the ER to have an intertrochanteric femur fracture and she is admitted for surgical fixation for this fracture.  HOSPITAL COURSE:  On admission, a medical consult was obtained for preoperative clearance and Dr. Delrae Alfred also ordered a cardiac consult.  They felt she was stable for surgery and that she subsequently underwent surgery later that day.  She tolerated surgery without immediate postoperative complications and was started on Coumadin protocol for DVT prophylaxis.  On postoperative day  #1, she was afebrile, vital signs stable.  Pain was well controlled with current medicines.  She was started on PT per protocol.  There were no cardiac symptoms noted and cardiology signed off.  Her legs were neurovascularly intact at that time.  Hemoglobin 9, hematocrit 26.3.  She was started on therapy per protocol and hemoglobin was monitored.  On postoperative day #2, she had had some low blood pressure and her medications were adjusted.  Pain was well controlled with current medicines. TMAX was 100.  Right thigh incision well approximated with staples and minimal drainage.  Hemoglobin was 8.6, hematocrit 25.2.  Ace wrap was placed on the right thigh.  Hemoglobin was monitored and transfusion was held at that time.  On January 10, 2001, she complained of some lightheadedness when she got out of bed the day before and her hemoglobin was 8.1 with hematocrit of 23.2.  She was subsequently transfused with two units of packed red blood cells.  TMAX was 100, vital signs stable, pain well controlled with current medicines.  On January 11, 2001, she tolerated transfusion the day before without difficulty.  Hemoglobin was 11.4, hematocrit 32.8 at this time.  TMAX 100.7, vital signs otherwise stable.  Legs neurovascularly intact.  She was continued on therapy per protocol.  She continued to make good progress over the next several days with her hemoglobin remaining stable.  On January 13, 2001, she was ready for transfer to rehabilitation and was transferred to rehabilitation that day.  DISCHARGE INSTRUCTIONS: 1. She is to continue her current hospitalization  diet and medications with    medications to be adjusted per the rehabilitation physicians.  These    medications included Toprol XL 50 mg p.o. q.d., Zocor 10 mg p.o. q.6 p.m.,    K-Dur 20 mEq p.o. q.d., hydrochlorothiazide 25 mg p.o. q.d., nitroglycerin    ointment one inch of ointment applied topically q.6h., Norvasc 2.5 mg p.o.    q.d. hold  for systolic blood pressure less than 110, Vicodin one to two    tablets p.o. q.4h. p.r.n. for pain, Phenergan 12.5 to 25 mg IM p.o. or p.r.    q.4h. p.r.n. nausea, EOC and LOC p.r.n., Coumadin one tablet p.o. q.d. with    the dose per pharmacy, Robaxin 500 to 1000 mg p.o. q.6h. p.r.n. spasm,    Benadryl 25 mg p.o. q.4-6h. p.r.n. itching. 2. She is to be out of bed with physical therapy, partial weightbearing 50%    or less on her right leg with use of walker. 3. She is to continue PT and OT per rehabilitation protocol. 4. Keep the right thigh incision clean and dry and clean with Betadine q.d.    Sterile dressing is to be applied. 5. She needs follow-up with Dr. Madelon Lips in our office by postoperative day    #14 if her staples have not been removed.  If they have been discontinued    in rehabilitation, then she needs follow-up about two to three weeks after    discharge. 6. Staples can be removed from her right hip incision at postoperative day    #14 with Steri-Strips with Benzoin applied. 7. She needs follow-up with Dr. Pete Glatter per his office.  LABORATORY DATA:  On January 07, 2001, hip films showed a right comminuted intertrochanteric femur fracture.  Chest x-ray at that time showed no active disease.  C-arm used on January 07, 2001, during the surgery showed open reduction and internal fixation of the right intertrochanteric femur fracture in good position and alignment.  On April 23, white count 10.9, hemoglobin 12.3, hematocrit 36.2.  On April 24, hemoglobin 9, hematocrit 26.3.  On April 25, hemoglobin 8.6, hematocrit 25.2. On April 26, hemoglobin 8.1, hematocrit 23.2.  On April 27, white count 8, hemoglobin 11.4, hematocrit 32.8, and platelets 229.  On April 23, PT 12.1, INR 0.9, PTT 32.  On April 29, PT 21.8 and INR 2.5.  On April 23, sodium 136, potassium 3.4, glucose 149, BUN 26, creatinine 1.3. On April 25, sodium 137, potassium 3.6, chloride 103, CO2 28, glucose 140,  BUN 12, creatinine 1.1, and calcium 8.4.   On April 23, urinalysis showed 1 mcg/dl of urobilinogen.  All other indices within normal limits.  All other laboratory studies were within normal limits. D:  02/12/01 TD:  02/12/01 Job: [id number]

## 2011-02-02 NOTE — Consult Note (Signed)
[place]. [facility]  Patient:    Sabrina Joyce, Sabrina Joyce                       MRN: [id number] Adm. Date:  01/07/01 Attending:  Julieanne Manson, M.D.                          Consultation Report  DATE OF BIRTH:  [birth year]  REASON FOR CONSULTATION:  Abnormal EKG in a patient preoperative for a right intertrochanteric hip fracture.  HISTORY OF PRESENT ILLNESS:  This is an 77 year old female with history of fairly controlled hypertension, hypercholesterolemia, small abdominal aortic aneurysm admitted through the emergency room this morning with a right intertrochanteric hip fracture after a fall around 0230 this morning.  The patient was noted to have inferolateral ST depression, T wave changes on EKG which in comparison is new from last EKG October 09, 1999 in her chart.  The patient is fairly active.  In fact, spent much of yesterday raking debris in her yard and also states she walks her dog regularly on uneven ground.  Her daughter, however, states that in March 2002, while visiting the patients husband in the hospital on a regular basis, she noted her mother needing to catch her breath climbing stairs on a regular basis.  The patient, however, denies orthopnea, PND symptoms.  She has no palpitations, no problems with lower extremity edema recently.  She has no history of an MI, anginal symptoms, or CHF.  No history of COPD or significant smoking history.  PAST MEDICAL HISTORY: 1. Hypertension with fair control in the 150s/80s generally. 2. Mild hypercholesterolemia on September 11, 2000 total was 204 with an HDL of    29 and an LDL of 118 on Zocor 10 mg. 3. Recent evaluation for vaginal discharge in the last year with findings of    urethral caruncle by Dr. Carey Bullocks, treated with Estrace cream. 4. History of right radial nerve palsy. 5. Femoral artery bruits, chronic. 6. Abdominal aortic aneurysm last measured October 17, 1999 at 2.6 x 2.4 cm.  PAST  SURGICAL HISTORY: 1. Status post appendectomy. 2. Status post cataract excision.  MEDICATIONS: 1. Toprol XL 50 mg p.o. q.d. 2. Zocor 10 mg p.o. q.d. 3. K-Dur 20 mEq p.o. q.d. 4. Norvasc 5 mg p.o. q.d. 5. Hydrochlorothiazide 25 mg daily. 6. Aspirin daily.  ALLERGIES:  No known drug allergies.  FAMILY HISTORY:  Father died at age 19 of an MI.  Mother died at age 77 colon cancer.  Brother died at age 70 of unknown cause.  Brother died at unknown age of peripheral vascular disease.  Sister died at age 37 of renal failure.  SOCIAL HISTORY:  No alcohol or tobacco use.  Married.  Husband recently hospitalized with cardiac problems.  PHYSICAL EXAMINATION:  VITAL SIGNS:  Temperature 97.4, pulse 94, respiratory rate 28, blood pressure 156/110, 97% SaO2 on room air.  GENERAL:  The patient is alert in no acute distress on pain medications lying in bed.  HEENT:  Pupils equal, round and reactive to light.  Extraocular movements intact.  NECK:  Supple without adenopathy.  No thyromegaly.  CHEST:  Clear.  CARDIOVASCULAR:  Regular rate and rhythm.  Normal S1 and S2.  No S3, S4, or murmur appreciated.  Normal dynamic and equal carotid, radial, femoral, DP pulses.  No peripheral edema.  ABDOMEN:  Soft.  Bowel sounds present throughout.  No hepatosplenomegaly or  masses appreciated.  Nontender.  She does not have an appreciable abdominal bruit.  NEUROLOGIC:  Alert and oriented x 3.  Cranial nerves II-XII grossly intact. Right leg is immobilized.  LABORATORY DATA:  EKG again shows new inferolateral ST depression.  UA is normal.  CBC with a white count of 10.9, hemoglobin 12.3, platelets 290. Potassium is somewhat low at 3.4.  ASSESSMENT AND PLAN: 1. Right intertrochanteric fracture will need repair as soon as possible. 2. Electrocardiogram changes with recent history of mild shortness of breath    with stairs past 2-3 months.  We will recommend continuing her    antihypertensives  prior to surgery.  Also, nitroglycerin paste and oxygen.    We will ask cardiology to see for any further recommendations    preoperatively.  Hopefully, will be able to undergo surgery this afternoon.    Dr. Deborah Chalk was notified and the patient was discussed.  He will be    talking to Dr. Donnie Aho. 3. Hypertension.  Increased currently, most likely secondary to pain.  Again,    recommend continuing medications. 4. Hypercholesterolemia.  We will hold Zocor for now.  DD:  01/07/01 TD:  01/07/01 Job: [id number]

## 2011-02-02 NOTE — Discharge Summary (Signed)
[place]. [facility]  Patient:    Sabrina Joyce, Sabrina Joyce                        MRN: [id number] Adm. Date:  [id number] Disc. Date: [id number] Attending:  Herold Harms Dictator:   Mcarthur Rossetti. Angiulli, P.A. CC:         Sharlot Gowda., M.D.  Darius Bump, M.D.   Discharge Summary  DISCHARGE DIAGNOSES: 1. Right intertrochanteric hip fracture with open reduction and internal    fixation on January 07, 2001. 2. Postoperative anemia. 3. Hypertension. 4. Abdominal aortic aneurysm. 5. Hyperlipidemia.  HISTORY OF PRESENT ILLNESS:  An 77 year old white female admitted on January 07, 2001, after a fall without loss of consciousness, sustaining a right intertrochanteric hip fracture.  Preoperative clearance per cardiology service, Darden Palmer., M.D.  Underwent open reduction and internal fixation on January 07, 2001, per W. Su Ley., M.D.  Placed on Coumadin for deep venous thrombosis prophylaxis and partial weightbearing. Postoperative anemia and transfused.  No chest pain or shortness of breath. Blood pressure with some orthostatic changes and monitored.  Moderate assist for ambulation and transfers.  Latest INR of 2.5.  Hemoglobin 11.4. Chemistries unremarkable.  Chest x-ray with no active disease.  Admitted for a comprehensive rehabilitation program.  PAST MEDICAL HISTORY:  See discharge diagnoses.  PAST SURGICAL HISTORY:  Appendectomy and cataract surgery.  ALLERGIES:  None.  SOCIAL HISTORY:  No alcohol or tobacco.  PRIMARY MEDICAL DOCTOR:  Darius Bump, M.D.  MEDICATIONS PRIOR TO ADMISSION: 1. Toprol XL 50 mg daily. 2. Zocor 10 mg daily. 3. K-Dur 20 mEq daily. 4. Norvasc 5 mg daily. 5. Hydrochlorothiazide 25 mg daily. 6. Aspirin daily.  SOCIAL HISTORY:  [place] and [place] Independent prior to admission and driving.  One-level home with one step to entry.  Husband with recent  bypass surgery.  Son works shift work.  Local daughter in area.  HOSPITAL COURSE:  The patient did well while on rehabilitation services with therapies initiated on a b.i.d. basis.  The following issues were followed during the patients rehabilitation course:  Pertaining to Ms. Danser right intertrochanteric hip, stable, surgical site healing nicely, no signs of infection, and she was ambulating with a walker with partial weightbearing. She continued on Coumadin for deep venous thrombosis prophylaxis.  Venous Doppler studies prior to discharge were negative.  She will complete Coumadin protocol followed by [facility] and resume her aspirin after Coumadin completed.  The postoperative anemia was stable with latest hemoglobin of 12.3 and hematocrit 35.3.  Blood pressures were monitored with Toprol, Norvasc, and hydrochlorothiazide as prior to hospital admission.  There was no headache or dizziness.  She had no bowel or bladder disturbances during her rehabilitation course.  Her appetite continued to improve.  Overall for her functional mobility, she was ambulating extended household distances with a walker, essentially independent to standby assist in all areas of activities of daily living and dressing, grooming, and homemaking.  Overall her strength and endurance greatly improved.  She was encouraged with her overall progress. The plan was to be discharged home on Jan 21, 2001, with [facility] physical and occupational therapy, as well as a Engineer, civil (consulting).  The day prior to discharge, her INR was 1.7.  The latest hemoglobin was 12.3, hematocrit 35.3, sodium 138, potassium 4.0, BUN 18, and creatinine 1.2.  DISCHARGE MEDICATIONS: 1. Coumadin daily  with dose to be established at the time of discharge to    complete Coumadin protocol. 2. Toprol XL 50 mg daily. 3. Zocor 10 mg daily. 4. Potassium chloride 10 mEq daily. 5. Hydrochlorothiazide 25 mg daily. 6. Norvasc 2.5 mg daily. 7.  Vicodin as needed for pain.  ACTIVITY:  Partial weightbearing with walker.  DIET:  Regular.  WOUND CARE:  Cleanse incision daily with soap and water.  SPECIAL INSTRUCTIONS:  Resume aspirin after Coumadin completed.  [facility] to complete Coumadin protocol.  FOLLOW-UP:  With Sharlot Gowda., M.D., of orthopedic services as advised.  With Darius Bump, M.D., for medical management. DD:  01/20/01 TD:  01/21/01 Job: [id number]

## 2011-06-11 LAB — CBC
HCT: 25 — ABNORMAL LOW
HCT: 29.2 — ABNORMAL LOW
HCT: 34.8 — ABNORMAL LOW
HCT: 37.3
Hemoglobin: 10.1 — ABNORMAL LOW
Hemoglobin: 11.9 — ABNORMAL LOW
Hemoglobin: 12.6
Hemoglobin: 8.5 — ABNORMAL LOW
MCHC: 33.7
MCHC: 34.1
MCHC: 34.2
MCHC: 34.4
MCV: 92.7
MCV: 92.9
Platelets: 189
RBC: 3.14 — ABNORMAL LOW
RBC: 3.75 — ABNORMAL LOW
RBC: 3.97
RDW: 13.6
RDW: 13.9
RDW: 14
WBC: 7
WBC: 8.4

## 2011-06-11 LAB — BASIC METABOLIC PANEL
BUN: 24 — ABNORMAL HIGH
BUN: 27 — ABNORMAL HIGH
CO2: 28
CO2: 29
CO2: 30
CO2: 31
Calcium: 8.5
Calcium: 8.8
Calcium: 9.2
Chloride: 100
Chloride: 103
Chloride: 99
Creatinine, Ser: 1.53 — ABNORMAL HIGH
Creatinine, Ser: 1.74 — ABNORMAL HIGH
GFR calc Af Amer: 33 — ABNORMAL LOW
GFR calc Af Amer: 38 — ABNORMAL LOW
GFR calc Af Amer: 39 — ABNORMAL LOW
GFR calc Af Amer: 43 — ABNORMAL LOW
GFR calc non Af Amer: 28 — ABNORMAL LOW
GFR calc non Af Amer: 32 — ABNORMAL LOW
GFR calc non Af Amer: 32 — ABNORMAL LOW
Glucose, Bld: 121 — ABNORMAL HIGH
Glucose, Bld: 125 — ABNORMAL HIGH
Glucose, Bld: 148 — ABNORMAL HIGH
Potassium: 3.1 — ABNORMAL LOW
Potassium: 3.4 — ABNORMAL LOW
Potassium: 3.5
Potassium: 3.9
Sodium: 137
Sodium: 137
Sodium: 140

## 2011-06-11 LAB — CROSSMATCH

## 2011-06-11 LAB — DIFFERENTIAL
Basophils Absolute: 0
Eosinophils Relative: 1
Lymphocytes Relative: 10 — ABNORMAL LOW
Monocytes Absolute: 0.8
Monocytes Relative: 8
Neutro Abs: 8.1 — ABNORMAL HIGH

## 2011-06-11 LAB — POCT I-STAT 4, (NA,K, GLUC, HGB,HCT)
HCT: 34 — ABNORMAL LOW
Hemoglobin: 11.6 — ABNORMAL LOW
Operator id: 238831
Potassium: 4.2
Sodium: 137

## 2011-06-11 LAB — CK TOTAL AND CKMB (NOT AT ARMC)
CK, MB: 5 — ABNORMAL HIGH
Relative Index: 2.9 — ABNORMAL HIGH
Relative Index: 3.1 — ABNORMAL HIGH

## 2011-06-11 LAB — APTT: aPTT: 28

## 2011-06-12 LAB — BASIC METABOLIC PANEL
BUN: 31 — ABNORMAL HIGH
BUN: 41 — ABNORMAL HIGH
BUN: 33 — ABNORMAL HIGH
BUN: 42 — ABNORMAL HIGH
BUN: 46 — ABNORMAL HIGH
BUN: 49 — ABNORMAL HIGH
BUN: 51 — ABNORMAL HIGH
BUN: 51 — ABNORMAL HIGH
CO2: 32
CO2: 26
CO2: 30
CO2: 32
Calcium: 8.4
Calcium: 9
Calcium: 8.3 — ABNORMAL LOW
Calcium: 8.8
Calcium: 9
Calcium: 9.1
Calcium: 9.2
Calcium: 9.3
Calcium: 9.3
Chloride: 92 — ABNORMAL LOW
Chloride: 94 — ABNORMAL LOW
Chloride: 98
Creatinine, Ser: 1.53 — ABNORMAL HIGH
Creatinine, Ser: 1.56 — ABNORMAL HIGH
Creatinine, Ser: 1.6 — ABNORMAL HIGH
Creatinine, Ser: 1.6 — ABNORMAL HIGH
Creatinine, Ser: 1.61 — ABNORMAL HIGH
Creatinine, Ser: 1.62 — ABNORMAL HIGH
GFR calc Af Amer: 33 — ABNORMAL LOW
GFR calc Af Amer: 37 — ABNORMAL LOW
GFR calc Af Amer: 37 — ABNORMAL LOW
GFR calc Af Amer: 37 — ABNORMAL LOW
GFR calc Af Amer: 38 — ABNORMAL LOW
GFR calc Af Amer: 39 — ABNORMAL LOW
GFR calc non Af Amer: 25 — ABNORMAL LOW
GFR calc non Af Amer: 30 — ABNORMAL LOW
GFR calc non Af Amer: 28 — ABNORMAL LOW
GFR calc non Af Amer: 29 — ABNORMAL LOW
GFR calc non Af Amer: 30 — ABNORMAL LOW
GFR calc non Af Amer: 30 — ABNORMAL LOW
GFR calc non Af Amer: 31 — ABNORMAL LOW
GFR calc non Af Amer: 31 — ABNORMAL LOW
GFR calc non Af Amer: 32 — ABNORMAL LOW
Glucose, Bld: 107 — ABNORMAL HIGH
Glucose, Bld: 105 — ABNORMAL HIGH
Glucose, Bld: 109 — ABNORMAL HIGH
Glucose, Bld: 88
Glucose, Bld: 98
Glucose, Bld: 98
Potassium: 3.8
Potassium: 3.4 — ABNORMAL LOW
Potassium: 3.5
Potassium: 3.6
Sodium: 135
Sodium: 138
Sodium: 138
Sodium: 140

## 2011-06-12 LAB — POCT CARDIAC MARKERS
Myoglobin, poc: 95.5
Operator id: 294521
Troponin i, poc: <0.05

## 2011-06-12 LAB — HEPATIC FUNCTION PANEL
ALT: 13
Bilirubin, Direct: 0.1
Indirect Bilirubin: 0.6

## 2011-06-12 LAB — CBC
HCT: 33.4 — ABNORMAL LOW
HCT: 35 — ABNORMAL LOW
HCT: 32.7 — ABNORMAL LOW
HCT: 33.8 — ABNORMAL LOW
HCT: 34.2 — ABNORMAL LOW
Hemoglobin: 11.4 — ABNORMAL LOW
Hemoglobin: 11.5 — ABNORMAL LOW
Hemoglobin: 11.5 — ABNORMAL LOW
MCHC: 34.7
MCHC: 33.8
MCHC: 33.9
MCHC: 35
MCV: 92.3
MCV: 92.8
MCV: 93.6
MCV: 93.6
Platelets: 161
Platelets: 261
Platelets: 265
RBC: 3.65 — ABNORMAL LOW
RBC: 3.65 — ABNORMAL LOW
RBC: 3.66 — ABNORMAL LOW
RBC: 3.67 — ABNORMAL LOW
RDW: 14.2
RDW: 14.3
RDW: 14.8
WBC: 7.1
WBC: 6.3
WBC: 7.5
WBC: 7.7

## 2011-06-12 LAB — DIFFERENTIAL
Eosinophils Absolute: 0.2
Lymphs Abs: 0.8
Monocytes Absolute: 0.6
Monocytes Relative: 8
Neutrophils Relative %: 79 — ABNORMAL HIGH

## 2011-06-12 LAB — URINALYSIS, ROUTINE W REFLEX MICROSCOPIC
Bilirubin Urine: NEGATIVE
Glucose, UA: NEGATIVE
Hgb urine dipstick: NEGATIVE
Protein, ur: NEGATIVE

## 2011-06-12 LAB — POCT I-STAT, CHEM 8
BUN: 54 — ABNORMAL HIGH
Hemoglobin: 11.6 — ABNORMAL LOW
Sodium: 138
TCO2: 31

## 2011-06-12 LAB — APTT: aPTT: 38 — ABNORMAL HIGH

## 2011-06-12 LAB — MAGNESIUM: Magnesium: 2.6 — ABNORMAL HIGH

## 2011-06-12 LAB — PROTIME-INR
INR: 1
Prothrombin Time: 12.9

## 2011-06-12 LAB — D-DIMER, QUANTITATIVE: D-Dimer, Quant: 3.13 — ABNORMAL HIGH

## 2011-06-12 LAB — CARDIAC PANEL(CRET KIN+CKTOT+MB+TROPI): Relative Index: INVALID

## 2011-09-06 ENCOUNTER — Other Ambulatory Visit: Payer: Self-pay | Admitting: Cardiology

## 2011-10-11 ENCOUNTER — Other Ambulatory Visit: Payer: Self-pay | Admitting: Cardiology

## 2011-10-25 ENCOUNTER — Other Ambulatory Visit: Payer: Self-pay | Admitting: Cardiology

## 2012-02-13 ENCOUNTER — Telehealth: Payer: Self-pay | Admitting: Oncology

## 2012-02-13 NOTE — Telephone Encounter (Signed)
called pts home s/w daughter Boonie scheduled appt for 06/12.  faxed over a letter to Dr. Jillyn Hidden with apt d/t

## 2012-02-22 ENCOUNTER — Encounter: Payer: Self-pay | Admitting: Oncology

## 2012-02-22 DIAGNOSIS — D649 Anemia, unspecified: Secondary | ICD-10-CM | POA: Insufficient documentation

## 2012-02-25 ENCOUNTER — Telehealth: Payer: Self-pay | Admitting: Oncology

## 2012-02-25 NOTE — Telephone Encounter (Signed)
Referred by Dr. Cain Saupe, Dx- Chronic Anemia

## 2012-02-26 ENCOUNTER — Encounter: Payer: Self-pay | Admitting: Oncology

## 2012-02-26 DIAGNOSIS — M810 Age-related osteoporosis without current pathological fracture: Secondary | ICD-10-CM | POA: Insufficient documentation

## 2012-02-26 DIAGNOSIS — I10 Essential (primary) hypertension: Secondary | ICD-10-CM | POA: Insufficient documentation

## 2012-02-26 DIAGNOSIS — I509 Heart failure, unspecified: Secondary | ICD-10-CM | POA: Insufficient documentation

## 2012-02-26 DIAGNOSIS — E785 Hyperlipidemia, unspecified: Secondary | ICD-10-CM | POA: Insufficient documentation

## 2012-02-26 DIAGNOSIS — J449 Chronic obstructive pulmonary disease, unspecified: Secondary | ICD-10-CM | POA: Insufficient documentation

## 2012-02-27 ENCOUNTER — Ambulatory Visit (HOSPITAL_BASED_OUTPATIENT_CLINIC_OR_DEPARTMENT_OTHER): Payer: Medicare Other | Admitting: Oncology

## 2012-02-27 ENCOUNTER — Encounter: Payer: Self-pay | Admitting: Oncology

## 2012-02-27 ENCOUNTER — Ambulatory Visit (HOSPITAL_BASED_OUTPATIENT_CLINIC_OR_DEPARTMENT_OTHER): Payer: Medicare Other

## 2012-02-27 ENCOUNTER — Telehealth: Payer: Self-pay | Admitting: Oncology

## 2012-02-27 ENCOUNTER — Other Ambulatory Visit (HOSPITAL_BASED_OUTPATIENT_CLINIC_OR_DEPARTMENT_OTHER): Payer: Medicare Other | Admitting: Lab

## 2012-02-27 VITALS — BP 134/67 | HR 69 | Temp 97.6°F | Ht 60.0 in | Wt 82.7 lb

## 2012-02-27 DIAGNOSIS — D649 Anemia, unspecified: Secondary | ICD-10-CM

## 2012-02-27 DIAGNOSIS — R718 Other abnormality of red blood cells: Secondary | ICD-10-CM

## 2012-02-27 DIAGNOSIS — M810 Age-related osteoporosis without current pathological fracture: Secondary | ICD-10-CM

## 2012-02-27 DIAGNOSIS — I509 Heart failure, unspecified: Secondary | ICD-10-CM

## 2012-02-27 DIAGNOSIS — E785 Hyperlipidemia, unspecified: Secondary | ICD-10-CM

## 2012-02-27 DIAGNOSIS — I10 Essential (primary) hypertension: Secondary | ICD-10-CM

## 2012-02-27 DIAGNOSIS — J449 Chronic obstructive pulmonary disease, unspecified: Secondary | ICD-10-CM

## 2012-02-27 LAB — CBC WITH DIFFERENTIAL/PLATELET
BASO%: 0.7 % (ref 0.0–2.0)
EOS%: 1.2 % (ref 0.0–7.0)
MCH: 28.8 pg (ref 25.1–34.0)
MCHC: 31.9 g/dL (ref 31.5–36.0)
RBC: 3.74 10*6/uL (ref 3.70–5.45)
RDW: 16.3 % — ABNORMAL HIGH (ref 11.2–14.5)
lymph#: 0.9 10*3/uL (ref 0.9–3.3)

## 2012-02-27 LAB — MORPHOLOGY: PLT EST: ADEQUATE

## 2012-02-27 NOTE — Patient Instructions (Signed)
1. Anemia of chronic kidney disease: This is due to chronic disease making less erythropoietin hormone secretion therefore you have less stimulus to the bone marrow to make red blood cell. I will also need to rule out other causes of anemia in this case such as multiple myeloma. I will call you if these tests back to be positive. 2. Treatment for anemia of chronic disease: Control your kidney function with blood pressure monitoring and medications. In the future if you have worsening anemia we may consider pack blood cell transfusion. If you require frequent blood transfusion, we may consider Aranesp injection to decrease the need for frequent blood transfusions. However I do not recommend this injection right away because of slight increased risk of stroke and heart attack.

## 2012-02-27 NOTE — Telephone Encounter (Signed)
Gave pt appt for lab and and MD visit for December 2013

## 2012-02-27 NOTE — Progress Notes (Signed)
[facility]  Telephone:([phone]) [phone] [phone]     INITIAL HEMATOLOGY CONSULTATION    Referral MD:  Dr. Cain Saupe, M.D.  Reason for Referral: normocytic anemia.     HPI: Sabrina Joyce is a 78 year-old woman with multiple medical diagnosis including HTN, HLP, COPD, CAD, CKD-stage III, osteoporosis, multiple past fractures from fall.  She was noted to have mild anemia since 2008.  However, within the past year, her anemia has worsened.  I had the chance to review record kindly sent to [place] by Dr. Debroah Baller office.  On 01/18/2010, her WBC was 4.8; Hgb 12.5; Plt 190.  On 02/05/2012, WBC was 6.1; Hgb 9.7; MCV 90; Plt 232.  Her TSH was normal.  Her fecal occult Guaiac was negative on 12/23/2011. She was started on empiric oral iron about 3 weeks ago.  She was kindly referred to the [facility] for evaluation.  [name] presented to the clinic for the first time today with her daughter.  She reports mild to moderate fatigue and over last few years given her age. She had history of bilateral hip fractures and therefore is not very mobile except for using a walker run the house. For the past few months she had been developing this exertion walking about 50 feet. She denies any visible source of bleeding such as epistaxis, gum bleed, hemoptysis, hematemesis, melena, hematochezia, vaginal bleeding, hematuria. She was having some ice pica; however, since she started on oral iron this is improved. For diagnosis that with oral iron last few weeks her dypsnea on exertion has slight improved as well.  She has normal appetite which is very low amount of food. She has not noticed any recent weight loss.   Patient denies headache, visual changes, confusion, drenching night sweats, palpable lymph node swelling, mucositis, odynophagia, dysphagia, nausea vomiting, jaundice, chest pain, palpitation, productive cough, abdominal pain, abdominal swelling, early satiety, skin rash, spontaneous  bleeding, joint swelling, joint pain, heat or cold intolerance, bowel bladder incontinence, back pain, focal motor weakness, paresthesia, depression, suicidal or homocidal ideation, feeling hopelessness.   Past Medical History  Diagnosis Date  . Chronic kidney disease (CKD), stage III (moderate)   . Anemia   . Hypertension   . Hyperlipidemia   . Osteoporosis   . Pelvic fracture   . COPD (chronic obstructive pulmonary disease)   . CHF (congestive heart failure)   . Colon polyps   . Abdominal aortic aneurysm   :    Past Surgical History  Procedure Date  . Bilateral hip fracture s/p repair   . Appendectomy   :   CURRENT MEDS: Current Outpatient Prescriptions  Medication Sig Dispense Refill  . acetaminophen (TYLENOL) 500 MG tablet Take 500 mg by mouth every 6 (six) hours as needed.      Marland Kitchen aspirin 81 MG tablet Take 81 mg by mouth daily.      . cholecalciferol (VITAMIN D) 1000 UNITS tablet Take 1,000 Units by mouth daily.      . ferrous sulfate 325 (65 FE) MG tablet Take 325 mg by mouth daily with breakfast.      . furosemide (LASIX) 40 MG tablet Take 40 mg by mouth 2 (two) times daily.      . isosorbide mononitrate (IMDUR) 60 MG 24 hr tablet Take 60 mg by mouth daily.      . metoprolol tartrate (LOPRESSOR) 25 MG tablet Take 25 mg by mouth 2 (two) times daily.      Marland Kitchen  nitroGLYCERIN (NITROSTAT) 0.4 MG SL tablet Place 0.4 mg under the tongue every 5 (five) minutes as needed.      . potassium chloride (K-DUR) 10 MEQ tablet Take 10 mEq by mouth 2 (two) times daily.          Allergies  Allergen Reactions  . Codeine Nausea Only  . Sulfa Antibiotics Nausea Only  :  Family History  Problem Relation Age of Onset  . Cancer Mother     possible colon cancer (not sure)  . Heart attack Father   . Kidney failure Sister   . Heart attack Brother   :  History   Social History  . Marital Status: Married    Spouse Name: N/A    Number of Children: 4  . Years of Education: N/A    Occupational History  . 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 Smoking status: Never Smoker   . Smokeless tobacco: Never Used  . Alcohol Use: No  . Drug Use: No  . Sexually Active: No   Other Topics Concern  . Not on file   Social History Narrative  . No narrative on file  :  REVIEW OF SYSTEM:  The rest of the 14-point review of sytem was negative.   Exam: ECOG 2  General: thin-appearing, elderly woman, in no acute distress.  Eyes:  no scleral icterus.  ENT:  There were no oropharyngeal lesions.  Neck was without thyromegaly.  Lymphatics:  Negative cervical, supraclavicular or axillary adenopathy.  Respiratory: lungs were clear bilaterally without wheezing or crackles.  Cardiovascular:  Regular rate and rhythm, S1/S2, without murmur, rub or gallop.  There was no pedal edema.  GI:  abdomen was soft, flat, nontender, nondistended, without organomegaly.  Muscoloskeletal:  no spinal tenderness of palpation of vertebral spine.  Skin exam was without echymosis, petichae.  Neuro exam was nonfocal.  Patient was not able to get on the tall exam table.  Gait was not accessed due to wheelchair bound status.  Patient was alerted and oriented.  She was slightly hard of hearing.  Attention was good.   Language was appropriate.  Mood was normal without depression.  Speech was not pressured.  Thought content was not tangential.    LABS:  Lab Results  Component Value Date   WBC 6.1 02/27/2012   HGB 10.8* 02/27/2012   HCT 33.8* 02/27/2012   PLT 293 02/27/2012   GLUCOSE 111* 09/30/2010   ALT 13 12/24/2007   AST 30 12/24/2007   NA 140 09/30/2010   K 4.0 09/30/2010   CL 102 09/30/2010   CREATININE 1.8* 09/30/2010   BUN 38* 09/30/2010   CO2 28 12/26/2007   INR 1.0 12/24/2007    Blood smear review:   I personally reviewed the patient's peripheral blood smear today.  There was anisocytosis.  There was no peripheral blast.  There were occasional rouleaux  formationThere was no schistocytosis, spherocytosis, target cell, tear drop cell.  There was no increased in central pallor or polychromasia. There was no giant platelets or platelet clumps.     ASSESSMENT AND PLAN:   1. Hypertension: Well controlled on Lasix, isosorbide, metoprolol per PCP. 2. Hyperlipidemia: She is on diet control per PCP. 3. History of CAD: She is on aspirin, isosorbide, metoprolol per PCP. 4. COPD: Unclear etiology as she never smoked nor had history of secondhand exposure. She does not require inhalers at this time.  5. Congestive heart failure: She is on Lasix, isosorbide, metoprolol. She is not on ACE inhibitor due to probably chronic kidney disease. I defer to PCP. 6. Chronic kidney disease stage III:  Most likely is due to history of hypertension. She has no evidence of fluid overload. However given her normocytic anemia, I need to rule out multiple myeloma. 7. History of bilateral hip fractures from presumably osteoporosis. However I need to rule out multiple myeloma. She is on vitamin D however not on calcium or bisphosphonate. I defer to PCP. 8. Hypokalemia: Due to Lasix. She is on potassium per PCP. 9. History of colonic polyp:  She has not had colonoscopy for many years. However given her extreme age and other medical conditions and negative fecal occult, her daughter has decided not to pursue any colon cancer screening. I agree with decision. 10. Normocytic anemia:  -Differential diagnosis: I am concerned for multiple myeloma given the fact that she has rouleaux formation on peripheral blood smear, renal insufficiency, and history of fracture. I therefore sent for serum protein electrophoresis and serum light chain today I cannot rule out iron if his anemia given the fact that her hemoglobin has improved with only 3 weeks of oral iron and she history of pica which is improved as well. I sent for iron panel today.  I cannot rule out myelodysplastic syndrome. However  given her extreme age and medical conditions, I do not want to subject her to a diagnostic bone marrow biopsy as she is not a good candidate for chemotherapy. Her lab tests ruled out hemolysis. -Therapy: Pending workup. However I recommend her to continue with oral iron and she has improvement of her  hemoglobin with improvement from 9.7 to 10.8 after 3 weeks of oral iron. She has not any complications of oral iron such as nausea vomiting or constipation. She does not have severe anemia to require blood transfusion today. If she turns out to have anemia of chronic kidney disease, the recommendation would be to control blood pressure and hopefully maintaining her renal function. I recommend follow up with the cancer Center in about 2 and 4 months for CBC to ensure that her hemoglobin continues to improve or stabilize.  I do not recommend using Aranesp injection right now is that she has history of coronary disease in the past.  Aranesp has been noted to slightly increase her risk of CAD and CVA.  Therefore it is she requires frequent blood transfusion then we may consider Aranesp at that time.   11. Follow up:  Lab appointment at the cancer Center in about 2 and 4 months. She has appointment to see [place] in about 6 months. I will see her sooner if she has other diagnoses such as multiple myeloma from her workup today.    Thank you for this referral.   The length of time of the face-to-face encounter was 60 minutes. More than 50% of time was spent counseling and coordination of care.

## 2012-02-29 LAB — COMPREHENSIVE METABOLIC PANEL
Albumin: 3.7 g/dL (ref 3.5–5.2)
Alkaline Phosphatase: 67 U/L (ref 39–117)
BUN: 28 mg/dL — ABNORMAL HIGH (ref 6–23)
Glucose, Bld: 97 mg/dL (ref 70–99)
Potassium: 4.2 mEq/L (ref 3.5–5.3)
Total Bilirubin: 0.4 mg/dL (ref 0.3–1.2)

## 2012-02-29 LAB — PROTEIN ELECTROPHORESIS, SERUM
Albumin ELP: 53.8 % — ABNORMAL LOW (ref 55.8–66.1)
Alpha-1-Globulin: 4.8 % (ref 2.9–4.9)
Beta 2: 6.2 % (ref 3.2–6.5)

## 2012-02-29 LAB — LACTATE DEHYDROGENASE: LDH: 196 U/L (ref 94–250)

## 2012-02-29 LAB — KAPPA/LAMBDA LIGHT CHAINS: Kappa:Lambda Ratio: 1.24 (ref 0.26–1.65)

## 2012-03-06 ENCOUNTER — Other Ambulatory Visit: Payer: Self-pay | Admitting: Cardiology

## 2012-03-06 NOTE — Progress Notes (Signed)
Sabrina Joyce, Sabrina Joyce M  Date of visit:  03/06/2012 DOB:  14-Feb-1917    Age:  78 yrs. Medical record number:  [id number]     Account number:  [id number] Primary Care Provider: Karmen Bongo ____________________________ CURRENT DIAGNOSES  1. CAD,Native  2. Hyperlipidemia  3. MI-S/P Subendocardial  4. Congestive Heart Failure Left  5. Peripheral Vascular Disease  6. Abdominal Aneurysm Without Mention Of Rupture  7. Hypertension-Essential (Benign)  8. Chronic Kidney Disease (Stage 3) ____________________________ ALLERGIES  ACE Inhibitors, Cough-non-productive  codeine sulfate  Neosporin  Polysporin  Sulfonamides ____________________________ MEDICATIONS  1. aspirin 81 mg Tablet, Chewable, 1 p.o. daily  2. Tylenol 325 mg Tablet, PRN  3. Vitamin D3 400 unit Tablet, 1 p.o. daily  4. isosorbide mononitrate 60 mg tablet extended release 24 hr, 1 p.o. daily  5. Klor-Con 10 10 mEq tablet extended release, 2 qd  6. metoprolol tartrate 25 mg tablet, BID  7. nitroglycerin 0.4 mg tablet, sublingual, 1 tab sl prn  8. furosemide 40 mg tablet, BID  9. Iron (ferrous sulfate) 325 mg (65 mg iron) tablet, 1 p.o. daily ____________________________ CHIEF COMPLAINTS  Followup of CAD,Native ____________________________ HISTORY OF PRESENT ILLNESS  Patient seen for cardiac followup. She has lost about 5 pounds of weight since she was here. She was seen recently by the hematologist and has had fatigue as well as dyspnea. A serum protein electrophoresis showed evidence of A light chains and there is a suggestion that she has multiple myeloma. She is pleasant today and has lost significant weight. She has not used nitroglycerin since she was here. She denies angina and has no PND orthopnea. She is disoriented today and does not know the date or the year. She continues to live with her daughter. ____________________________ PAST HISTORY  Past Medical Illnesses:  hypertension, dementia, AAA, hyperlipidemia;   Cardiovascular Illnesses:  S/P MI-non-transmural May 2007, peripheral vascular disease, CHF;  Surgical Procedures:  appendectomy, hip replacement-rt;  Cardiology Procedures-Invasive:  no history of prior cardiac procedures;  Cardiology Procedures-Noninvasive:  adenosine cardiolite, echocardiogram May 2007;  LVEF of 45% documented via echocardiogram on 01/31/2006 ____________________________ CARDIO-PULMONARY TEST DATES EKG Date:  03/06/2012;  Nuclear Study Date:  09/03/2003;  Echocardiography Date: 01/31/2006;  Chest Xray Date: 08/27/2003;   ____________________________ SOCIAL HISTORY Alcohol Use:  no alcohol use;  Smoking:  used to smoke but quit Prior to 1980;  Diet:  regular diet;  Lifestyle:  widowed, 3 sons and 1 daughter;  Exercise:  exercise is limited due to physical disability;  Occupation:  retired;  Residence:  lives with daughter;   ____________________________ REVIEW OF SYSTEMS General:  malaise and fatigue, weight loss of approximately 5 lbs  Eyes:  cataract extraction bilaterally  Respiratory:  mild dyspnea with exertion  Cardiovascular:  please review HPI  Abdominal:  denies dyspepsia, GI bleeding, constipation, or diarrhea  Genitourinary-Female:  no dysuria, urgency, frequency  Musculoskeletal:  mild arthritis, osteoporosis, sacral and pelivc fractures    Neurological:  history of radial nerve palsy, mild dementia ____________________________ PHYSICAL EXAMINATION VITAL SIGNS  Blood Pressure:  124/60 Sitting, Right arm, regular cuff   Pulse:  76/min. Weight:  79.00 lbs. Height:  60"BMI: 15  Constitutional:  pleasant white female, in no acute distress, appears stated age in wheelchair Skin:  warm and dry to touch, no apparent skin lesions, or masses noted. Head:  normocephalic, normal hair pattern, no masses or tenderness ENT:  ears, nose and throat reveal no gross abnormalities.  Dentition good. Neck:  supple, no masses, thyromegaly,  JVD. Carotid pulses are full and equal  bilaterally without bruits. Chest:  normal symmetry, clear to auscultation and percussion., kyphotic Cardiac:  regular rhythm, normal S1 and S2, No S3 or S4, no murmurs, gallops or rubs detected. Peripheral Pulses:  femoral pulses 1+, dorsalis pedis pulses 1+, posterior tibial pulses 1+ Extremities & Back:  no edema present Neurological:  unsteady gait, not oriented to date ____________________________ MOST RECENT LIPID PANEL 08/22/10  CHOL TOTL 161 mg/dl, LDL 80 NM, HDL 64 mg/dl, TRIGLYCER 84 mg/dl and CHOL/HDL 2.5 (Calc) ____________________________ IMPRESSIONS/PLAN  1. Hypertensive heart disease with blood pressure currently controlled 2. Atherosclerotic cardiovascular disease 3. History of congestive heart failure which is compensated 4. Stage III chronic kidney disease 5. Multiple myeloma  Recommendations:  Her EKG shows her to be in sinus rhythm with nonspecific ST changes. Recent labwork seem to show a A light chain multiple myeloma. I will await further recommendations from the hematologist but at her age would really not consider her to be a candidate for chemotherapy. I will see her in followup in 6 months. ____________________________ TODAYS ORDERS  1. Return Visit: 6 months  2. 12 Lead EKG: Today                       ____________________________ Cardiology Physician:  Darden Palmer MD [facility]

## 2012-04-28 ENCOUNTER — Other Ambulatory Visit: Payer: Medicare Other

## 2012-04-28 ENCOUNTER — Telehealth: Payer: Self-pay | Admitting: *Deleted

## 2012-04-28 NOTE — Telephone Encounter (Signed)
per the request from the patient's daughter rescheduled the lab only appointment for 05-05-2012 at 2:00pm

## 2012-05-05 ENCOUNTER — Other Ambulatory Visit (HOSPITAL_BASED_OUTPATIENT_CLINIC_OR_DEPARTMENT_OTHER): Payer: Medicare Other | Admitting: Lab

## 2012-05-05 DIAGNOSIS — E785 Hyperlipidemia, unspecified: Secondary | ICD-10-CM

## 2012-05-05 DIAGNOSIS — I509 Heart failure, unspecified: Secondary | ICD-10-CM

## 2012-05-05 DIAGNOSIS — D649 Anemia, unspecified: Secondary | ICD-10-CM

## 2012-05-05 DIAGNOSIS — J449 Chronic obstructive pulmonary disease, unspecified: Secondary | ICD-10-CM

## 2012-05-05 DIAGNOSIS — I10 Essential (primary) hypertension: Secondary | ICD-10-CM

## 2012-05-05 DIAGNOSIS — M810 Age-related osteoporosis without current pathological fracture: Secondary | ICD-10-CM

## 2012-05-05 LAB — CBC WITH DIFFERENTIAL/PLATELET
BASO%: 0.9 % (ref 0.0–2.0)
EOS%: 1.2 % (ref 0.0–7.0)
Eosinophils Absolute: 0.1 10*3/uL (ref 0.0–0.5)
MCH: 30.6 pg (ref 25.1–34.0)
MCHC: 32.6 g/dL (ref 31.5–36.0)
MCV: 93.9 fL (ref 79.5–101.0)
MONO%: 9.7 % (ref 0.0–14.0)
NEUT#: 4.4 10*3/uL (ref 1.5–6.5)
RBC: 3.76 10*6/uL (ref 3.70–5.45)
RDW: 15.3 % — ABNORMAL HIGH (ref 11.2–14.5)

## 2012-05-06 ENCOUNTER — Telehealth: Payer: Self-pay

## 2012-05-06 NOTE — Telephone Encounter (Signed)
Message copied by Kallie Locks on Tue May 06, 2012  1:35 PM ------      Message from: HA, Raliegh Ip T      Created: Mon May 05, 2012  6:07 PM       Please call pt and advise her to continue oral iron as she is doing now (for iron deficiency anemia).  Her Hgb is steadily improving.   Thanks.

## 2012-05-07 ENCOUNTER — Telehealth: Payer: Self-pay | Admitting: *Deleted

## 2012-05-07 NOTE — Telephone Encounter (Signed)
Called pt w/ results of Hgb and relayed Dr. Lodema Pilot message her hgb improving and to continue to take oral iron as directed.  Keep next lab appt as scheduled in October. She verbalized understanding.

## 2012-05-07 NOTE — Telephone Encounter (Signed)
Message copied by Wende Mott on Wed May 07, 2012 10:36 AM ------      Message from: HA, Raliegh Ip T      Created: Mon May 05, 2012  6:07 PM       Please call pt and advise her to continue oral iron as she is doing now (for iron deficiency anemia).  Her Hgb is steadily improving.   Thanks.

## 2012-06-30 ENCOUNTER — Other Ambulatory Visit (HOSPITAL_BASED_OUTPATIENT_CLINIC_OR_DEPARTMENT_OTHER): Payer: Medicare Other | Admitting: Lab

## 2012-06-30 DIAGNOSIS — I509 Heart failure, unspecified: Secondary | ICD-10-CM

## 2012-06-30 DIAGNOSIS — J449 Chronic obstructive pulmonary disease, unspecified: Secondary | ICD-10-CM

## 2012-06-30 DIAGNOSIS — M810 Age-related osteoporosis without current pathological fracture: Secondary | ICD-10-CM

## 2012-06-30 DIAGNOSIS — E785 Hyperlipidemia, unspecified: Secondary | ICD-10-CM

## 2012-06-30 DIAGNOSIS — D649 Anemia, unspecified: Secondary | ICD-10-CM

## 2012-06-30 DIAGNOSIS — I10 Essential (primary) hypertension: Secondary | ICD-10-CM

## 2012-06-30 LAB — CBC WITH DIFFERENTIAL/PLATELET
BASO%: 0.9 % (ref 0.0–2.0)
Eosinophils Absolute: 0.1 10*3/uL (ref 0.0–0.5)
MONO#: 0.6 10*3/uL (ref 0.1–0.9)
NEUT#: 4.3 10*3/uL (ref 1.5–6.5)
RBC: 3.81 10*6/uL (ref 3.70–5.45)
RDW: 14.3 % (ref 11.2–14.5)
WBC: 6 10*3/uL (ref 3.9–10.3)
lymph#: 1 10*3/uL (ref 0.9–3.3)

## 2012-07-21 IMAGING — CT CT MAXILLOFACIAL W/O CM
5 of 9 series · 17 of 37 positions shown, 18 images · non-contrast
Comparison: 12/20/2007 head CT

CT HEAD

CLINICAL DATA: Fall with head, face, and neck injury and pain in
these areas.

CT HEAD WITHOUT CONTRAST
CT MAXILLOFACIAL WITHOUT CONTRAST
CT CERVICAL SPINE WITHOUT CONTRAST
TECHNIQUE: Multidetector CT imaging of the head, cervical spine,
and maxillofacial structures were performed using the standard
protocol without intravenous contrast. Multiplanar CT image
reconstructions of the cervical spine and maxillofacial structures
were also generated.

[Series 3: recon 2: brain · axial · 0.47mm/px · z∈[+232,+321]mm · 4 of 56 slices shown, 5 images]
[im 12/56  brain]
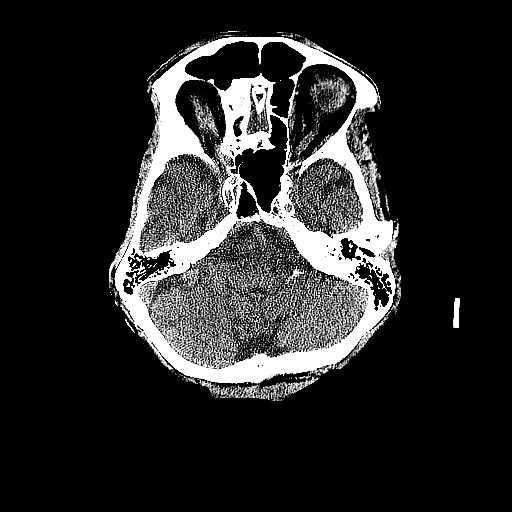
[im 12/56  bone]
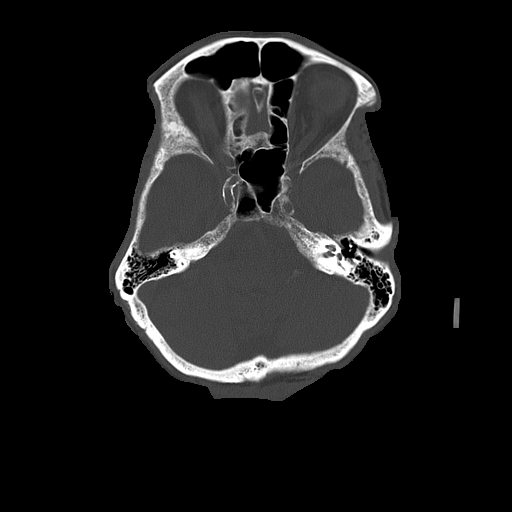
[im 23/56  bone]
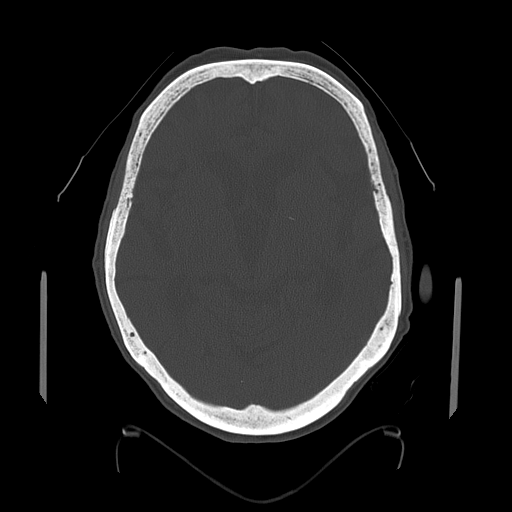
[im 34/56  bone]
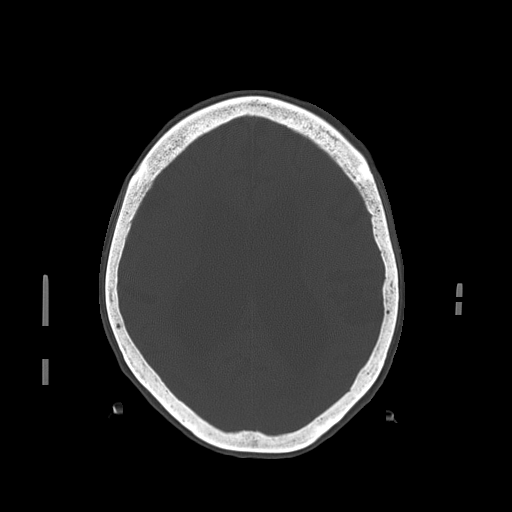
[im 45/56  bone]
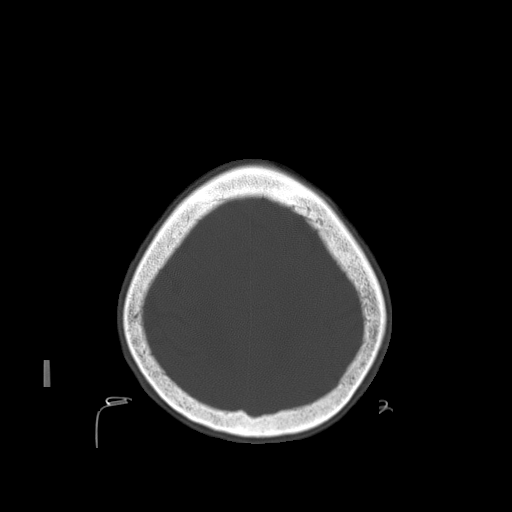

[Series 4: cervical spine · axial · 0.25mm/px · z∈[+55,+145]mm · 4 of 60 slices shown]
[im 12/60  bone]
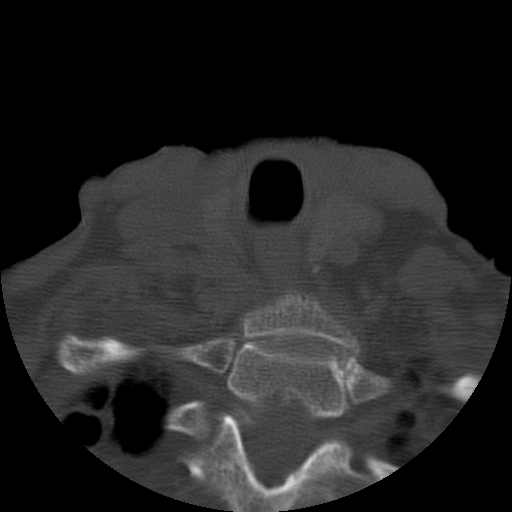
[im 24/60  bone]
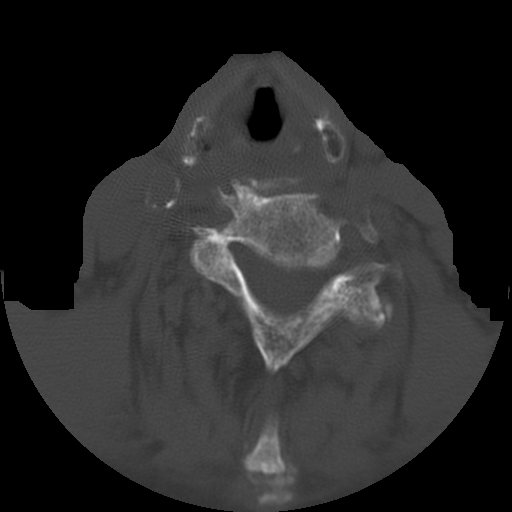
[im 36/60  bone]
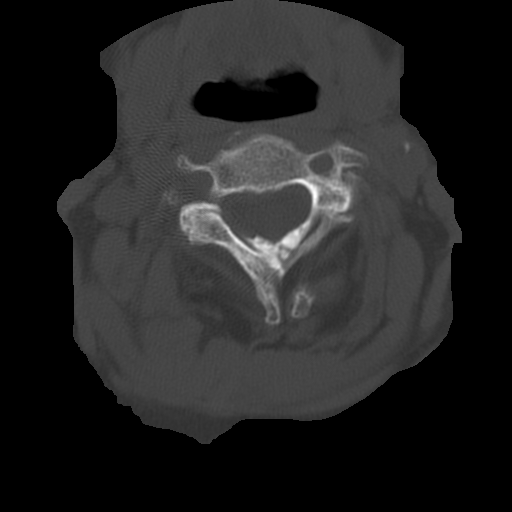
[im 48/60  bone]
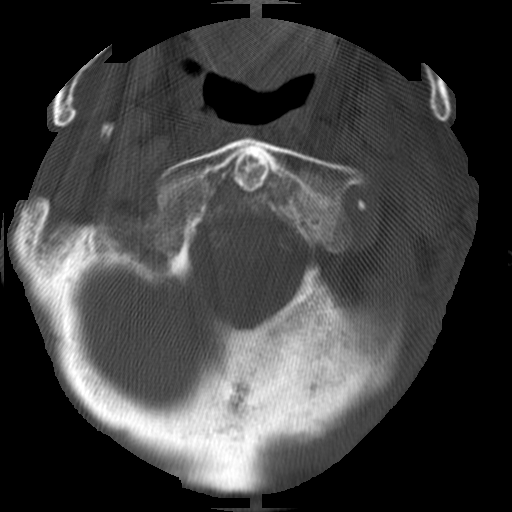

[Series 5: recon 2: cervical spine · axial · 0.25mm/px · z∈[+55,+145]mm · 4 of 60 slices shown]
[im 12/60  bone]
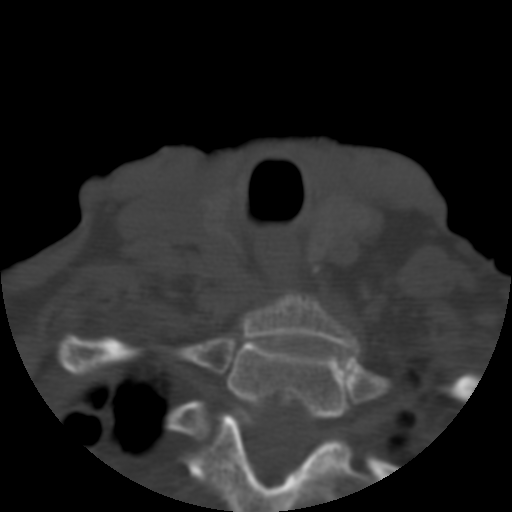
[im 24/60  bone]
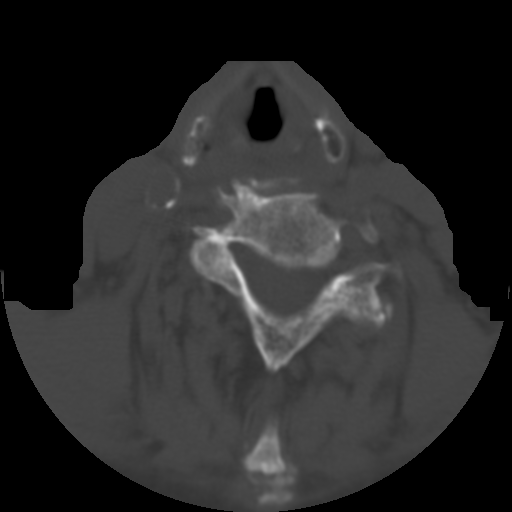
[im 36/60  bone]
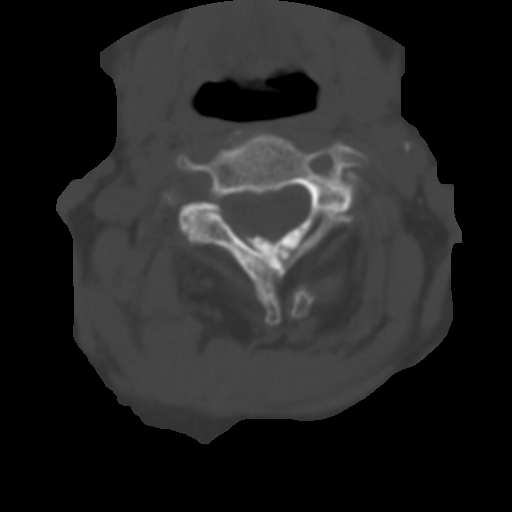
[im 48/60  bone]
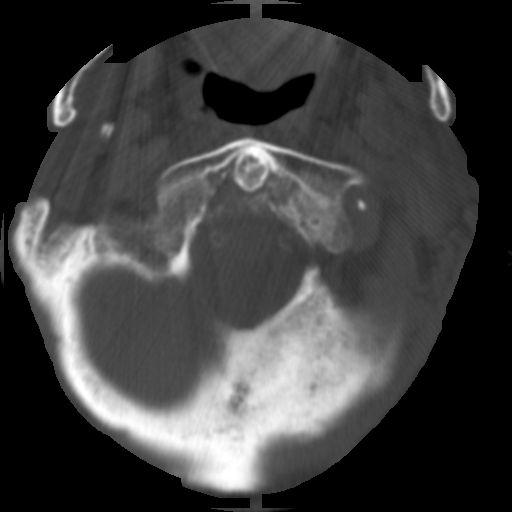

[Series 8: recon 2: supine facial bones · axial · 0.33mm/px · z∈[+126,+156]mm · 2 of 59 slices shown]
[im 12/59  bone]
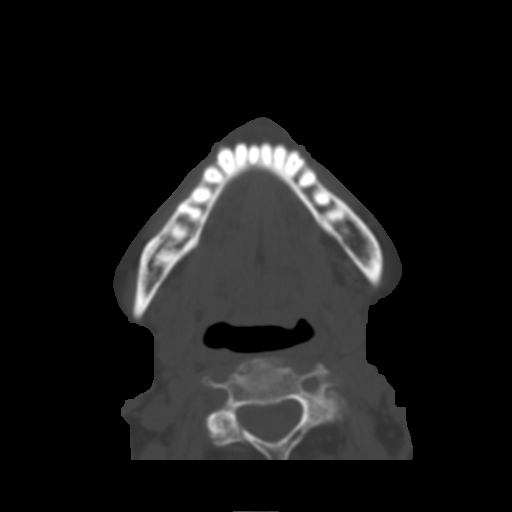
[im 24/59  bone]
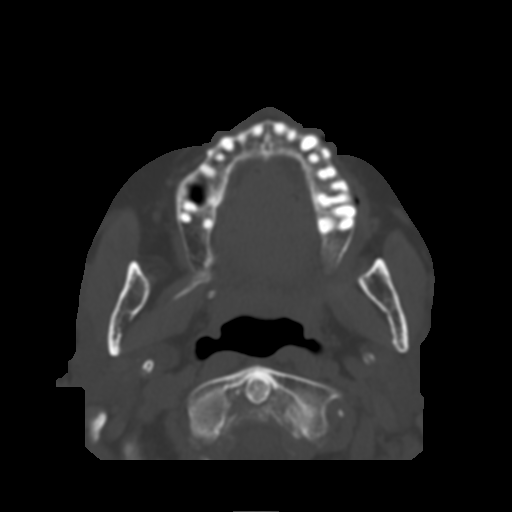

[Series 601: coronal c-spine · coronal · 0.30mm/px · 3 of 36 slices shown]
[im 8/36  bone]
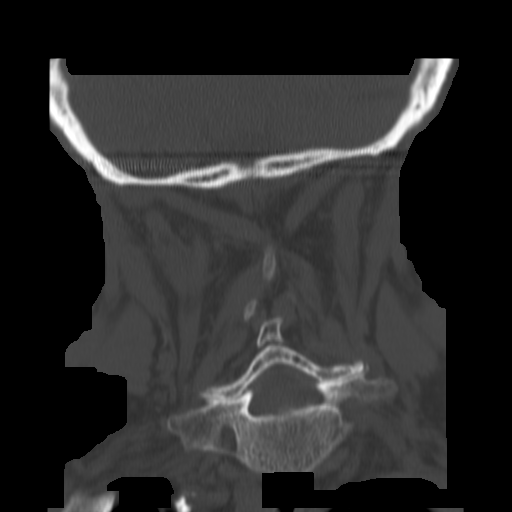
[im 15/36  bone]
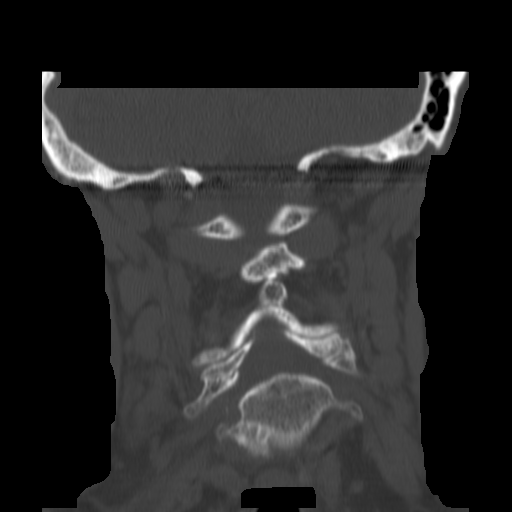
[im 22/36  bone]
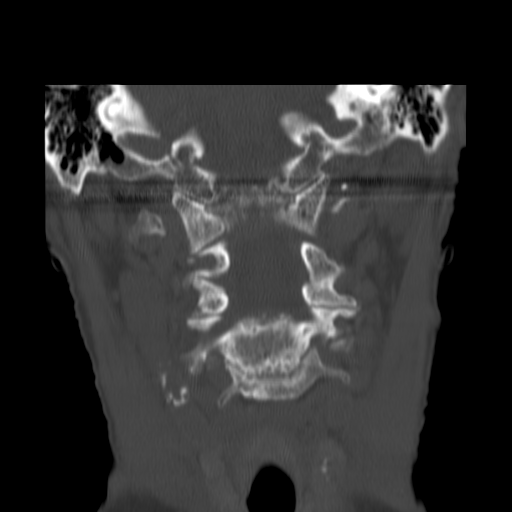

[17 of 37 positions shown; findings below may reference images not displayed]

FINDINGS: Moderate to severe chronic white matter disease is again
noted.
Mild ventriculomegaly is stable.

No acute intracranial abnormalities are identified, including mass
lesion or mass effect, hydrocephalus, extra-axial fluid collection,
midline shift, hemorrhage, or acute infarction.

The visualized bony calvarium is unremarkable.
IMPRESSION: No evidence of acute intracranial abnormality.

Moderate severe chronic white matter disease.

CT MAXILLOFACIAL
FINDINGS: A nondisplaced right nasal bone fracture is identified.
No other fractures are identified.
There is no evidence of subluxation or dislocation.
The paranasal sinuses, mastoid air cells and middle/inner ears are
clear.

The orbits and globes are within normal limits.
Moderate degenerative changes of the TMJs are noted.
IMPRESSION: Nondisplaced right nasal bone fracture.

CT CERVICAL SPINE
FINDINGS: Degenerative changes at C1-C2 noted with posterior
subluxation of the left lateral mass of C1 on C2.  This appears
unchanged from prior studies.
Moderate degenerative bone/soft tissue posterior to the odontoid
process has some mass effect on the proximal cervical cord.

There is no evidence of acute fracture, subluxation or prevertebral
soft tissue swelling.

Bony fusion of C5-C6 is again noted.
Moderate multilevel degenerative disc disease, spondylosis and
facet arthropathy again identified.
Soft tissue structures  are within normal limits.
IMPRESSION: No static evidence of acute injury to the cervical spine.

Moderate to severe degenerative changes at C1-C2.

Moderate multilevel degenerative changes throughout the cervical
spine.

## 2012-08-28 ENCOUNTER — Ambulatory Visit (HOSPITAL_BASED_OUTPATIENT_CLINIC_OR_DEPARTMENT_OTHER): Payer: Medicare Other | Admitting: Oncology

## 2012-08-28 ENCOUNTER — Telehealth: Payer: Self-pay | Admitting: Oncology

## 2012-08-28 ENCOUNTER — Other Ambulatory Visit (HOSPITAL_BASED_OUTPATIENT_CLINIC_OR_DEPARTMENT_OTHER): Payer: Medicare Other | Admitting: Lab

## 2012-08-28 ENCOUNTER — Encounter: Payer: Self-pay | Admitting: Oncology

## 2012-08-28 ENCOUNTER — Other Ambulatory Visit: Payer: Self-pay | Admitting: Oncology

## 2012-08-28 VITALS — BP 122/70 | HR 68 | Temp 97.5°F | Resp 16

## 2012-08-28 DIAGNOSIS — D649 Anemia, unspecified: Secondary | ICD-10-CM

## 2012-08-28 DIAGNOSIS — I10 Essential (primary) hypertension: Secondary | ICD-10-CM

## 2012-08-28 DIAGNOSIS — J449 Chronic obstructive pulmonary disease, unspecified: Secondary | ICD-10-CM

## 2012-08-28 LAB — CBC WITH DIFFERENTIAL/PLATELET
Basophils Absolute: 0.1 10*3/uL (ref 0.0–0.1)
HCT: 34.2 % — ABNORMAL LOW (ref 34.8–46.6)
HGB: 11.6 g/dL (ref 11.6–15.9)
LYMPH%: 14.4 % (ref 14.0–49.7)
MCH: 33 pg (ref 25.1–34.0)
MONO#: 0.6 10*3/uL (ref 0.1–0.9)
NEUT%: 70.9 % (ref 38.4–76.8)
Platelets: 187 10*3/uL (ref 145–400)
lymph#: 1 10*3/uL (ref 0.9–3.3)

## 2012-08-28 LAB — COMPREHENSIVE METABOLIC PANEL (CC13)
BUN: 31 mg/dL — ABNORMAL HIGH (ref 7.0–26.0)
CO2: 33 mEq/L — ABNORMAL HIGH (ref 22–29)
Calcium: 9.2 mg/dL (ref 8.4–10.4)
Chloride: 102 mEq/L (ref 98–107)
Creatinine: 1.6 mg/dL — ABNORMAL HIGH (ref 0.6–1.1)

## 2012-08-28 NOTE — Telephone Encounter (Signed)
appts made and printed for pt aom °

## 2012-08-30 NOTE — Progress Notes (Signed)
[facility]  Telephone:([phone] Fax:([phone]) [phone]   OFFICE PROGRESS NOTE   Cc:  FULP, [name], MD  DIAGNOSIS: Anemia due to chronic kidney disease.  CURRENT THERAPY: Ferrous sulfate 325 mg by mouth daily.  INTERVAL HISTORY: Sabrina Joyce 78 y.o. female returns for routine follow-up with her daughter. She continues to have mild to moderate fatigue that is unchanged. Her performance status is really limited and she spends most of her time in the wheelchair or in a chair at home. She denies any visible source of bleeding such as epistaxis, gum bleed, hemoptysis, hematemesis, melena, hematochezia, vaginal bleeding, hematuria. She denies chest pain and shortness of breath at rest. Dyspnea on exertion is stable. Appetite is fair; she could not stand to be weight today.   Past Medical History  Diagnosis Date  . Chronic kidney disease (CKD), stage III (moderate)   . Anemia   . Hypertension   . Hyperlipidemia   . Osteoporosis   . Pelvic fracture   . COPD (chronic obstructive pulmonary disease)   . CHF (congestive heart failure)   . Colon polyps   . Abdominal aortic aneurysm   . CAD (coronary artery disease) 2007    s/p MI x 1    Past Surgical History  Procedure Date  . Bilateral hip fracture s/p repair   . Appendectomy     Current Outpatient Prescriptions  Medication Sig Dispense Refill  . acetaminophen (TYLENOL) 500 MG tablet Take 500 mg by mouth every 6 (six) hours as needed.      Marland Kitchen aspirin 81 MG tablet Take 81 mg by mouth daily.      . cholecalciferol (VITAMIN D) 1000 UNITS tablet Take 1,000 Units by mouth daily.      . ferrous sulfate 325 (65 FE) MG tablet Take 325 mg by mouth daily with breakfast.      . furosemide (LASIX) 40 MG tablet Take 40 mg by mouth 2 (two) times daily.      . isosorbide mononitrate (IMDUR) 60 MG 24 hr tablet Take 60 mg by mouth daily.      . metoprolol tartrate (LOPRESSOR) 25 MG tablet Take 25 mg by mouth 2 (two) times daily.       . nitroGLYCERIN (NITROSTAT) 0.4 MG SL tablet Place 0.4 mg under the tongue every 5 (five) minutes as needed.      . potassium chloride (K-DUR) 10 MEQ tablet Take 10 mEq by mouth 2 (two) times daily.        ALLERGIES:  is allergic to codeine and sulfa antibiotics.  REVIEW OF SYSTEMS:  The rest of the 14-point review of system was negative.   Filed Vitals:   08/28/12 1341  BP: 122/70  Pulse: 68  Temp: 97.5 F (36.4 C)  Resp: 16   Wt Readings from Last 3 Encounters:  02/27/12 82 lb 11.2 oz (37.512 kg)   ECOG Performance status: 2  PHYSICAL EXAMINATION:   General: thin-appearing, elderly woman, in no acute distress. Eyes: no scleral icterus. ENT: There were no oropharyngeal lesions. Neck was without thyromegaly. Lymphatics: Negative cervical, supraclavicular or axillary adenopathy. Respiratory: lungs were clear bilaterally without wheezing or crackles. Cardiovascular: Regular rate and rhythm, S1/S2, without murmur, rub or gallop. There was no pedal edema. GI: abdomen was soft, flat, nontender, nondistended, without organomegaly. Muscoloskeletal: no spinal tenderness of palpation of vertebral spine. Skin exam was without echymosis, petichae. Neuro exam was nonfocal. Patient was not able to get on the tall exam table. Gait  was not accessed due to wheelchair bound status. Patient was alerted and oriented. She was slightly hard of hearing. Attention was good. Language was appropriate. Mood was normal without depression. Speech was not pressured. Thought content was not tangential.   LABORATORY/RADIOLOGY DATA:  Lab Results  Component Value Date   WBC 7.0 08/28/2012   HGB 11.6 08/28/2012   HCT 34.2* 08/28/2012   PLT 187 08/28/2012   GLUCOSE 134* 08/28/2012   ALKPHOS 77 08/28/2012   ALT <6 08/28/2012   AST 16 08/28/2012   NA 142 08/28/2012   K 4.0 08/28/2012   CL 102 08/28/2012   CREATININE 1.6* 08/28/2012   BUN 31.0* 08/28/2012   CO2 33* 08/28/2012   INR 1.0 12/24/2007     ASSESSMENT AND PLAN:   1. Hypertension: Well controlled on Lasix, isosorbide, metoprolol per PCP. 2. Hyperlipidemia: She is on diet control per PCP. 3. History of CAD: She is on aspirin, isosorbide, metoprolol per PCP. 4. COPD: Unclear etiology as she never smoked nor had history of secondhand exposure. She does not require inhalers at this time.  5. Congestive heart failure: She is on Lasix, isosorbide, metoprolol. She is not on ACE inhibitor due to probably chronic kidney disease. I defer to PCP. 6. Chronic kidney disease stage III: Most likely is due to history of hypertension. She has no evidence of fluid overload.  7. History of bilateral hip fractures from presumably osteoporosis. However I need to rule out multiple myeloma. She is on vitamin D however not on calcium or bisphosphonate. I willdefer to PCP. 8. Hypokalemia: Due to Lasix. She is on potassium per PCP. 9. History of colonic polyp: She has not had colonoscopy for many years. However given her extreme age and other medical conditions and negative fecal occult, her daughter has decided not to pursue any colon cancer screening. I agree with decision. 10. Normocytic anemia: Due to chronic kidney disease. Hemoglobin has normalized today. Recommend continued observation.  11. Follow-up: In 3 months for lab only and visit in 6 months.       The length of time of the face-to-face encounter was 15 minutes. More than 50% of time was spent counseling and coordination of care.

## 2012-11-26 ENCOUNTER — Other Ambulatory Visit: Payer: Medicare Other | Admitting: Lab

## 2012-11-26 ENCOUNTER — Telehealth: Payer: Self-pay | Admitting: Oncology

## 2012-11-26 NOTE — Telephone Encounter (Signed)
pt called to r/s lab....Done °

## 2012-12-09 ENCOUNTER — Other Ambulatory Visit: Payer: Medicare Other

## 2013-03-04 ENCOUNTER — Telehealth: Payer: Self-pay | Admitting: Oncology

## 2013-03-04 ENCOUNTER — Ambulatory Visit (HOSPITAL_BASED_OUTPATIENT_CLINIC_OR_DEPARTMENT_OTHER): Payer: Medicare Other | Admitting: Oncology

## 2013-03-04 ENCOUNTER — Other Ambulatory Visit (HOSPITAL_BASED_OUTPATIENT_CLINIC_OR_DEPARTMENT_OTHER): Payer: Medicare Other | Admitting: Lab

## 2013-03-04 VITALS — BP 114/63 | HR 65 | Temp 97.1°F | Resp 17 | Ht 60.0 in

## 2013-03-04 DIAGNOSIS — D649 Anemia, unspecified: Secondary | ICD-10-CM

## 2013-03-04 LAB — CBC WITH DIFFERENTIAL/PLATELET
BASO%: 1 % (ref 0.0–2.0)
Basophils Absolute: 0.1 10*3/uL (ref 0.0–0.1)
Eosinophils Absolute: 0.2 10*3/uL (ref 0.0–0.5)
HCT: 30.9 % — ABNORMAL LOW (ref 34.8–46.6)
HGB: 10.4 g/dL — ABNORMAL LOW (ref 11.6–15.9)
MONO#: 0.7 10*3/uL (ref 0.1–0.9)
NEUT#: 4.4 10*3/uL (ref 1.5–6.5)
NEUT%: 68.1 % (ref 38.4–76.8)
WBC: 6.5 10*3/uL (ref 3.9–10.3)
lymph#: 1.1 10*3/uL (ref 0.9–3.3)

## 2013-03-04 LAB — COMPREHENSIVE METABOLIC PANEL (CC13)
ALT: <6 U/L (ref 0–55)
BUN: 22.9 mg/dL (ref 7.0–26.0)
CO2: 28 mEq/L (ref 22–29)
Calcium: 9.1 mg/dL (ref 8.4–10.4)
Chloride: 102 mEq/L (ref 98–107)
Creatinine: 1.5 mg/dL — ABNORMAL HIGH (ref 0.6–1.1)

## 2013-03-04 NOTE — Patient Instructions (Addendum)
1.  Diagnosis:  Anemia due to chronic kidney disease.  2.  Treatment:    *  Continue to observe at this time.  *  Consider Aranesp injection once a month in the future if anemia significantly worsens.  Aranesp is a hormone that can stimulate bone marrow production of red blood cell in about 5070% of patients.  Potential benefit is to increase strength/stamina and avoid the need to get blood transfusion.  Potential risk include but not limited to slight increase in risk of blood clot, stroke, heart attack.  3.  Follow up:  Lab appointment at the cancer center in about 3 and 6 months.  Return visit in about 9 months.

## 2013-03-04 NOTE — Progress Notes (Signed)
[facility]  Telephone:([phone] Fax:(336) [phone]   OFFICE PROGRESS NOTE   Cc:  FULP, [name], MD  DIAGNOSIS:  Anemia of chronic disease   CURRENT THERAPY: oral iron supplement.     INTERVAL HISTORY: Sabrina Joyce 79 y.o. female returns for regular follow up with her daughter.  She was very hard of hearing.  Her daughter provided the history.  She is frail and is wheelchair bound.  She denied any bleeding, SOB, chest pain.  She has no appetite, but denied further weight loss.  The rest of the 14-point review of system was negative.   Past Medical History  Diagnosis Date  . Chronic kidney disease (CKD), stage III (moderate)   . Anemia   . Hypertension   . Hyperlipidemia   . Osteoporosis   . Pelvic fracture   . COPD (chronic obstructive pulmonary disease)   . CHF (congestive heart failure)   . Colon polyps   . Abdominal aortic aneurysm   . CAD (coronary artery disease) 2007    s/p MI x 1    Past Surgical History  Procedure Laterality Date  . Bilateral hip fracture s/p repair    . Appendectomy      Current Outpatient Prescriptions  Medication Sig Dispense Refill  . acetaminophen (TYLENOL) 325 MG tablet Take 650 mg by mouth every 6 (six) hours as needed for pain.      Marland Kitchen aspirin 81 MG tablet Take 81 mg by mouth daily.      . cholecalciferol (VITAMIN D) 1000 UNITS tablet Take 1,000 Units by mouth daily.      . furosemide (LASIX) 40 MG tablet Take 40 mg by mouth 2 (two) times daily.      . isosorbide mononitrate (IMDUR) 60 MG 24 hr tablet Take 60 mg by mouth daily.      . metoprolol tartrate (LOPRESSOR) 25 MG tablet Take 25 mg by mouth 2 (two) times daily.      . potassium chloride (K-DUR) 10 MEQ tablet Take 10 mEq by mouth 2 (two) times daily.      . ferrous sulfate 325 (65 FE) MG tablet Take 325 mg by mouth daily with breakfast.      . nitroGLYCERIN (NITROSTAT) 0.4 MG SL tablet Place 0.4 mg under the tongue every 5 (five) minutes as needed.        No current facility-administered medications for this visit.    ALLERGIES:  is allergic to codeine and sulfa antibiotics.  REVIEW OF SYSTEMS:  The rest of the 14-point review of system was negative.   Filed Vitals:   03/04/13 1336  BP: 114/63  Pulse: 65  Temp: 97.1 F (36.2 C)  Resp: 17   Wt Readings from Last 3 Encounters:  02/27/12 82 lb 11.2 oz (37.512 kg)   ECOG Performance status: 2-3  PHYSICAL EXAMINATION:   General: thin-appearing woman, in no acute distress.  Eyes:  no scleral icterus.  ENT:  There were no oropharyngeal lesions.  Neck was without thyromegaly.  Lymphatics:  Negative cervical, supraclavicular or axillary adenopathy.  Respiratory: lungs were clear bilaterally without wheezing or crackles.  Cardiovascular:  Regular rate and rhythm, S1/S2, without murmur, rub or gallop.  There was no pedal edema.  GI:  abdomen was soft, flat, nontender, nondistended, without organomegaly. She was wheelchair bound.   LABORATORY/RADIOLOGY DATA:  Lab Results  Component Value Date   WBC 6.5 03/04/2013   HGB 10.4* 03/04/2013   HCT 30.9* 03/04/2013   PLT  226 03/04/2013   GLUCOSE 97 03/04/2013   ALKPHOS 74 03/04/2013   ALT <6 Repeated and Verified 03/04/2013   AST 14 03/04/2013   NA 140 03/04/2013   K 4.3 03/04/2013   CL 102 03/04/2013   CREATININE 1.5* 03/04/2013   BUN 22.9 03/04/2013   CO2 28 03/04/2013   INR 1.0 12/24/2007     ASSESSMENT AND PLAN:   1.  Diagnosis:  Anemia due to chronic kidney disease.  2.  Treatment:    *  Increase oral iron to twice daily.  She is not having side effect from it.   *  Consider Aranesp injection once a month in the future if anemia significantly worsens.  Aranesp is a hormone that can stimulate bone marrow production of red blood cell in about 5070% of patients.  Potential benefit is to increase strength/stamina and avoid the need to get blood transfusion.  Potential risk include but not limited to slight increase in risk of blood clot, stroke,  heart attack.   3.  Follow up:  Lab appointment at the cancer center in about 3 and 6 months.  Return visit in about 9 months.    I informed Ms. Thune and her daughter that I am leaving the practice.  The [facility] will arrange for her to see another provider when she returns.     The length of time of the face-to-face encounter was 10 minutes. More than 50% of time was spent counseling and coordination of care.

## 2013-06-03 ENCOUNTER — Other Ambulatory Visit: Payer: Self-pay | Admitting: *Deleted

## 2013-06-03 DIAGNOSIS — D649 Anemia, unspecified: Secondary | ICD-10-CM

## 2013-06-04 ENCOUNTER — Other Ambulatory Visit (HOSPITAL_BASED_OUTPATIENT_CLINIC_OR_DEPARTMENT_OTHER): Payer: Medicare Other | Admitting: Lab

## 2013-06-04 DIAGNOSIS — D649 Anemia, unspecified: Secondary | ICD-10-CM

## 2013-06-04 LAB — CBC WITH DIFFERENTIAL/PLATELET
BASO%: 1.1 % (ref 0.0–2.0)
Eosinophils Absolute: 0.2 10*3/uL (ref 0.0–0.5)
HCT: 30.8 % — ABNORMAL LOW (ref 34.8–46.6)
LYMPH%: 15.4 % (ref 14.0–49.7)
MONO#: 0.6 10*3/uL (ref 0.1–0.9)
NEUT#: 4.4 10*3/uL (ref 1.5–6.5)
Platelets: 204 10*3/uL (ref 145–400)
RBC: 3.21 10*6/uL — ABNORMAL LOW (ref 3.70–5.45)
WBC: 6.2 10*3/uL (ref 3.9–10.3)
lymph#: 0.9 10*3/uL (ref 0.9–3.3)

## 2013-07-31 ENCOUNTER — Telehealth: Payer: Self-pay | Admitting: Hematology and Oncology

## 2013-07-31 NOTE — Telephone Encounter (Signed)
ret call to pt who requested conf we recd her mess to cancel 11/18 lab appt  Done shh

## 2013-08-04 ENCOUNTER — Other Ambulatory Visit: Payer: Medicare Other

## 2013-09-29 ENCOUNTER — Telehealth: Payer: Self-pay | Admitting: *Deleted

## 2013-09-29 NOTE — Telephone Encounter (Signed)
Referral received from Dr. Chapman Fitch for pt to f/u w/  Dr. Alvy Bimler for Multiple Myeloma.  Called Dr. Siri Cole office and left VM for MA informing pt has appt to see Dr. Alvy Bimler on 12/02/13.  Dr. Alvy Bimler reviewed pt's chart and no history of mulitple myeloma found.  Pt being seen here for Anemia of CKD.

## 2013-10-22 ENCOUNTER — Encounter: Payer: Self-pay | Admitting: Podiatrist

## 2013-10-22 ENCOUNTER — Ambulatory Visit (INDEPENDENT_AMBULATORY_CARE_PROVIDER_SITE_OTHER): Payer: Commercial Managed Care - HMO | Admitting: Podiatrist

## 2013-10-22 VITALS — BP 125/65 | HR 77 | Resp 12

## 2013-10-22 DIAGNOSIS — M79609 Pain in unspecified limb: Secondary | ICD-10-CM

## 2013-10-22 DIAGNOSIS — B351 Tinea unguium: Secondary | ICD-10-CM

## 2013-10-22 NOTE — Progress Notes (Signed)
HPI:  Patient presents today for follow up of foot and nail care.She is 80 years old and presents today with her daughter- her feet are sensitive to the touch and her toenails have not been trimmed in quite some time.  She does not walk and gets around in a wheel chair.    Objective:  Patients chart is reviewed.  Vascular status reveals pedal pulses noted at 1 out of 4 dp and pt bilateral .  Neurological sensation is intact bilaterally.   Patients nails are severely elongated, ingrown, thickened, discolored, distrophic, friable and brittle with yellow-brown discoloration. Patient subjectively relates they are painful.  She has a foot deformity on the right foot with an medially dislocted and overlapping 2nd toe over the first toe.  She complains of pain in the toes when trimmed.  Assessment:  Symptomatic onychomycosis  Plan:  Discussed treatment options and alternatives.  The symptomatic toenails were debrided through manual an mechanical means without complication but with some outbursts of discomfort.  She will return if the toenails become painful again in the future.   Trudie Buckler, DPM

## 2013-12-01 ENCOUNTER — Other Ambulatory Visit: Payer: Self-pay | Admitting: Hematology and Oncology

## 2013-12-01 ENCOUNTER — Encounter: Payer: Self-pay | Admitting: Hematology and Oncology

## 2013-12-01 DIAGNOSIS — D649 Anemia, unspecified: Secondary | ICD-10-CM

## 2013-12-01 DIAGNOSIS — N183 Chronic kidney disease, stage 3 unspecified: Secondary | ICD-10-CM

## 2013-12-01 DIAGNOSIS — D539 Nutritional anemia, unspecified: Secondary | ICD-10-CM | POA: Insufficient documentation

## 2013-12-01 HISTORY — DX: Nutritional anemia, unspecified: D53.9

## 2013-12-02 ENCOUNTER — Encounter: Payer: Self-pay | Admitting: Hematology and Oncology

## 2013-12-02 ENCOUNTER — Other Ambulatory Visit (HOSPITAL_BASED_OUTPATIENT_CLINIC_OR_DEPARTMENT_OTHER): Payer: Commercial Managed Care - HMO

## 2013-12-02 ENCOUNTER — Ambulatory Visit (HOSPITAL_BASED_OUTPATIENT_CLINIC_OR_DEPARTMENT_OTHER): Payer: Commercial Managed Care - HMO | Admitting: Hematology and Oncology

## 2013-12-02 VITALS — BP 103/47 | HR 67 | Temp 98.1°F | Resp 16 | Ht 60.0 in

## 2013-12-02 DIAGNOSIS — N183 Chronic kidney disease, stage 3 unspecified: Secondary | ICD-10-CM

## 2013-12-02 DIAGNOSIS — D649 Anemia, unspecified: Secondary | ICD-10-CM

## 2013-12-02 DIAGNOSIS — C4490 Unspecified malignant neoplasm of skin, unspecified: Secondary | ICD-10-CM

## 2013-12-02 DIAGNOSIS — F0390 Unspecified dementia without behavioral disturbance: Secondary | ICD-10-CM

## 2013-12-02 DIAGNOSIS — D638 Anemia in other chronic diseases classified elsewhere: Secondary | ICD-10-CM

## 2013-12-02 DIAGNOSIS — D539 Nutritional anemia, unspecified: Secondary | ICD-10-CM

## 2013-12-02 LAB — BASIC METABOLIC PANEL (CC13)
ANION GAP: 11 meq/L (ref 3–11)
BUN: 31.8 mg/dL — AB (ref 7.0–26.0)
CO2: 27 mEq/L (ref 22–29)
CREATININE: 1.4 mg/dL — AB (ref 0.6–1.1)
Calcium: 9.1 mg/dL (ref 8.4–10.4)
Chloride: 105 mEq/L (ref 98–109)
Glucose: 198 mg/dl — ABNORMAL HIGH (ref 70–140)
Potassium: 3.5 mEq/L (ref 3.5–5.1)
Sodium: 142 mEq/L (ref 136–145)

## 2013-12-02 LAB — CBC & DIFF AND RETIC
BASO%: 1 % (ref 0.0–2.0)
Basophils Absolute: 0.1 10*3/uL (ref 0.0–0.1)
EOS%: 4.9 % (ref 0.0–7.0)
Eosinophils Absolute: 0.3 10*3/uL (ref 0.0–0.5)
HCT: 27.6 % — ABNORMAL LOW (ref 34.8–46.6)
HGB: 8.4 g/dL — ABNORMAL LOW (ref 11.6–15.9)
Immature Retic Fract: 10.5 % — ABNORMAL HIGH (ref 1.60–10.00)
LYMPH%: 28 % (ref 14.0–49.7)
MCH: 29.5 pg (ref 25.1–34.0)
MCHC: 30.4 g/dL — ABNORMAL LOW (ref 31.5–36.0)
MCV: 96.8 fL (ref 79.5–101.0)
MONO#: 0.5 10*3/uL (ref 0.1–0.9)
MONO%: 8 % (ref 0.0–14.0)
NEUT%: 58.1 % (ref 38.4–76.8)
NEUTROS ABS: 3.5 10*3/uL (ref 1.5–6.5)
Platelets: 226 10*3/uL (ref 145–400)
RBC: 2.85 10*6/uL — AB (ref 3.70–5.45)
RDW: 13.9 % (ref 11.2–14.5)
RETIC %: 2.05 % (ref 0.70–2.10)
RETIC CT ABS: 58.43 10*3/uL (ref 33.70–90.70)
WBC: 6.1 10*3/uL (ref 3.9–10.3)
lymph#: 1.7 10*3/uL (ref 0.9–3.3)

## 2013-12-02 LAB — SEDIMENTATION RATE: SED RATE: 92 mm/h — AB (ref 0–22)

## 2013-12-02 LAB — FERRITIN CHCC: FERRITIN: 81 ng/mL (ref 9–269)

## 2013-12-02 LAB — VITAMIN B12: VITAMIN B 12: 389 pg/mL (ref 211–911)

## 2013-12-02 NOTE — Progress Notes (Signed)
[facility] OFFICE PROGRESS NOTE  Joyce, [name], MD DIAGNOSIS:  Chronic anemia  SUMMARY OF HEMATOLOGIC HISTORY: This patient was seen by another physician for chronic anemia. She was put on iron supplement. The patient has significant dementia and is very frail. She has chronic kidney disease stage III and significant skin cancers. INTERVAL HISTORY: Sabrina Joyce 80 y.o. female returns for further followup. History is not possible from the patient. She has very poor hearing. According to her daughter, her activity is minimum. She does not notice any new bleeding.  I have reviewed the past medical history, past surgical history, social history and family history with the patient and they are unchanged from previous note.  ALLERGIES:  is allergic to codeine; sulfa antibiotics; and neosporin.  MEDICATIONS:  Current Outpatient Prescriptions  Medication Sig Dispense Refill  . acetaminophen (TYLENOL) 325 MG tablet Take 650 mg by mouth every 6 (six) hours as needed for pain.      Marland Kitchen aspirin 81 MG tablet Take 81 mg by mouth daily.      . cholecalciferol (VITAMIN D) 1000 UNITS tablet Take 1,000 Units by mouth daily.      . ferrous sulfate 325 (65 FE) MG tablet Take 325 mg by mouth daily with breakfast.      . furosemide (LASIX) 40 MG tablet Take 40 mg by mouth 2 (two) times daily.      . isosorbide mononitrate (IMDUR) 60 MG 24 hr tablet Take 60 mg by mouth daily.      Marland Kitchen lidocaine (XYLOCAINE) 5 % ointment       . metoprolol tartrate (LOPRESSOR) 25 MG tablet Take 25 mg by mouth 2 (two) times daily.      . nitroGLYCERIN (NITROSTAT) 0.4 MG SL tablet Place 0.4 mg under the tongue every 5 (five) minutes as needed.      . potassium chloride (K-DUR) 10 MEQ tablet Take 10 mEq by mouth 2 (two) times daily.       No current facility-administered medications for this visit.     REVIEW OF SYSTEMS:   All other systems were reviewed with the patient and are negative.  PHYSICAL  EXAMINATION: ECOG PERFORMANCE STATUS: 3 - Symptomatic, >50% confined to bed  Filed Vitals:   12/02/13 1405  BP: 103/47  Pulse: 67  Temp: 98.1 F (36.7 C)  Resp: 16   Filed Weights    GENERAL:alert, no distress and comfortable. She looks thin and very frail SKIN: Significant skin cancers noted throughout her body. EYES: Conjunctiva are pale NEURO: alert, no focal motor/sensory deficits  LABORATORY DATA:  I have reviewed the data as listed Results for orders placed in visit on 12/02/13 (from the past 48 hour(s))  CBC & DIFF AND RETIC     Status: Abnormal   Collection Time    12/02/13  1:50 PM      Result Value Ref Range   WBC 6.1  3.9 - 10.3 10e3/uL   NEUT# 3.5  1.5 - 6.5 10e3/uL   HGB 8.4 (*) 11.6 - 15.9 g/dL   HCT 27.6 (*) 34.8 - 46.6 %   Platelets 226  145 - 400 10e3/uL   MCV 96.8  79.5 - 101.0 fL   MCH 29.5  25.1 - 34.0 pg   MCHC 30.4 (*) 31.5 - 36.0 g/dL   RBC 2.85 (*) 3.70 - 5.45 10e6/uL   RDW 13.9  11.2 - 14.5 %   lymph# 1.7  0.9 - 3.3 10e3/uL   MONO# 0.5  0.1 - 0.9 10e3/uL   Eosinophils Absolute 0.3  0.0 - 0.5 10e3/uL   Basophils Absolute 0.1  0.0 - 0.1 10e3/uL   NEUT% 58.1  38.4 - 76.8 %   LYMPH% 28.0  14.0 - 49.7 %   MONO% 8.0  0.0 - 14.0 %   EOS% 4.9  0.0 - 7.0 %   BASO% 1.0  0.0 - 2.0 %   Retic % 2.05  0.70 - 2.10 %   Retic Ct Abs 58.43  33.70 - 90.70 10e3/uL   Immature Retic Fract 10.50 (*) 1.60 - 10.00 %    Lab Results  Component Value Date   WBC 6.1 12/02/2013   HGB 8.4* 12/02/2013   HCT 27.6* 12/02/2013   MCV 96.8 12/02/2013   PLT 226 12/02/2013   ASSESSMENT & PLAN:  #1 anemia chronic disease #2 chronic kidney disease #3 significant debility with moderate dementia and multiple comorbidities I discussed with her daughter Her blood count is slightly worse. Ferritin and B12 levels are pending. I will call her to discuss test results over the phone once they are available. I discussed with her daughter the risks and benefits of erythropoietin  stimulating agents. Her daughter states it is near impossible to bring her mother in and out every few weeks to get blood work & injection. I did not make a return appointment for the patient to come back. I told her it is not unreasonable to stop checking her blood counts from now. All questions were answered. The patient knows to call the clinic with any problems, questions or concerns. No barriers to learning was detected.  I spent 15 minutes counseling the patient face to face. The total time spent in the appointment was 20 minutes and more than 50% was on counseling.     [place], [place], MD 12/02/2013 2:30 PM

## 2013-12-16 ENCOUNTER — Other Ambulatory Visit: Payer: Self-pay | Admitting: Family Medicine

## 2013-12-16 DIAGNOSIS — M79609 Pain in unspecified limb: Secondary | ICD-10-CM

## 2013-12-17 ENCOUNTER — Ambulatory Visit
Admission: RE | Admit: 2013-12-17 | Discharge: 2013-12-17 | Disposition: A | Discharge status: Home / Self Care | Payer: Medicare HMO | Source: Ambulatory Visit | Attending: Family Medicine | Admitting: Family Medicine

## 2013-12-17 DIAGNOSIS — M79609 Pain in unspecified limb: Secondary | ICD-10-CM

## 2014-02-24 ENCOUNTER — Inpatient Hospital Stay (HOSPITAL_COMMUNITY)
Admission: EM | Admit: 2014-02-24 | Discharge: 2014-03-03 | DRG: 871 | Disposition: A | Discharge status: Hospice | Attending: Internal Medicine | Admitting: Internal Medicine

## 2014-02-24 ENCOUNTER — Encounter (HOSPITAL_COMMUNITY): Payer: Self-pay | Admitting: Emergency Medicine

## 2014-02-24 DIAGNOSIS — D638 Anemia in other chronic diseases classified elsewhere: Secondary | ICD-10-CM | POA: Diagnosis present

## 2014-02-24 DIAGNOSIS — E785 Hyperlipidemia, unspecified: Secondary | ICD-10-CM

## 2014-02-24 DIAGNOSIS — I129 Hypertensive chronic kidney disease with stage 1 through stage 4 chronic kidney disease, or unspecified chronic kidney disease: Secondary | ICD-10-CM | POA: Diagnosis present

## 2014-02-24 DIAGNOSIS — D539 Nutritional anemia, unspecified: Secondary | ICD-10-CM

## 2014-02-24 DIAGNOSIS — R5381 Other malaise: Secondary | ICD-10-CM | POA: Diagnosis present

## 2014-02-24 DIAGNOSIS — Z66 Do not resuscitate: Secondary | ICD-10-CM | POA: Diagnosis present

## 2014-02-24 DIAGNOSIS — E872 Acidosis, unspecified: Secondary | ICD-10-CM | POA: Diagnosis present

## 2014-02-24 DIAGNOSIS — R627 Adult failure to thrive: Secondary | ICD-10-CM | POA: Diagnosis present

## 2014-02-24 DIAGNOSIS — A419 Sepsis, unspecified organism: Secondary | ICD-10-CM

## 2014-02-24 DIAGNOSIS — F0390 Unspecified dementia without behavioral disturbance: Secondary | ICD-10-CM | POA: Diagnosis present

## 2014-02-24 DIAGNOSIS — I509 Heart failure, unspecified: Secondary | ICD-10-CM

## 2014-02-24 DIAGNOSIS — M19019 Primary osteoarthritis, unspecified shoulder: Secondary | ICD-10-CM | POA: Diagnosis present

## 2014-02-24 DIAGNOSIS — D649 Anemia, unspecified: Secondary | ICD-10-CM

## 2014-02-24 DIAGNOSIS — N179 Acute kidney failure, unspecified: Secondary | ICD-10-CM

## 2014-02-24 DIAGNOSIS — E43 Unspecified severe protein-calorie malnutrition: Secondary | ICD-10-CM

## 2014-02-24 DIAGNOSIS — T68XXXA Hypothermia, initial encounter: Secondary | ICD-10-CM

## 2014-02-24 DIAGNOSIS — J4489 Other specified chronic obstructive pulmonary disease: Secondary | ICD-10-CM | POA: Diagnosis present

## 2014-02-24 DIAGNOSIS — Z882 Allergy status to sulfonamides status: Secondary | ICD-10-CM

## 2014-02-24 DIAGNOSIS — N390 Urinary tract infection, site not specified: Secondary | ICD-10-CM

## 2014-02-24 DIAGNOSIS — I5022 Chronic systolic (congestive) heart failure: Secondary | ICD-10-CM | POA: Diagnosis present

## 2014-02-24 DIAGNOSIS — Z681 Body mass index (BMI) 19 or less, adult: Secondary | ICD-10-CM

## 2014-02-24 DIAGNOSIS — M810 Age-related osteoporosis without current pathological fracture: Secondary | ICD-10-CM

## 2014-02-24 DIAGNOSIS — I252 Old myocardial infarction: Secondary | ICD-10-CM

## 2014-02-24 DIAGNOSIS — Z8249 Family history of ischemic heart disease and other diseases of the circulatory system: Secondary | ICD-10-CM

## 2014-02-24 DIAGNOSIS — E876 Hypokalemia: Secondary | ICD-10-CM | POA: Diagnosis not present

## 2014-02-24 DIAGNOSIS — A412 Sepsis due to unspecified staphylococcus: Principal | ICD-10-CM | POA: Diagnosis present

## 2014-02-24 DIAGNOSIS — N183 Chronic kidney disease, stage 3 unspecified: Secondary | ICD-10-CM

## 2014-02-24 DIAGNOSIS — Z885 Allergy status to narcotic agent status: Secondary | ICD-10-CM

## 2014-02-24 DIAGNOSIS — J449 Chronic obstructive pulmonary disease, unspecified: Secondary | ICD-10-CM

## 2014-02-24 DIAGNOSIS — R6520 Severe sepsis without septic shock: Secondary | ICD-10-CM

## 2014-02-24 DIAGNOSIS — Z888 Allergy status to other drugs, medicaments and biological substances status: Secondary | ICD-10-CM

## 2014-02-24 DIAGNOSIS — Z85828 Personal history of other malignant neoplasm of skin: Secondary | ICD-10-CM

## 2014-02-24 DIAGNOSIS — Z515 Encounter for palliative care: Secondary | ICD-10-CM

## 2014-02-24 DIAGNOSIS — Z7982 Long term (current) use of aspirin: Secondary | ICD-10-CM

## 2014-02-24 DIAGNOSIS — E860 Dehydration: Secondary | ICD-10-CM | POA: Diagnosis present

## 2014-02-24 DIAGNOSIS — I2510 Atherosclerotic heart disease of native coronary artery without angina pectoris: Secondary | ICD-10-CM | POA: Diagnosis present

## 2014-02-24 DIAGNOSIS — I10 Essential (primary) hypertension: Secondary | ICD-10-CM

## 2014-02-24 DIAGNOSIS — E162 Hypoglycemia, unspecified: Secondary | ICD-10-CM

## 2014-02-24 MED ORDER — SODIUM CHLORIDE 0.9 % IV SOLN
Freq: Once | INTRAVENOUS | Status: AC
  Administered 2014-02-25: 01:00:00 via INTRAVENOUS

## 2014-02-24 NOTE — ED Provider Notes (Signed)
CSN: [id number]     Arrival date & time 02/24/14  2314 History   First MD Initiated Contact with Patient 02/24/14 2331     Chief Complaint  Patient presents with  . Hypoglycemia     (Consider location/radiation/quality/duration/timing/severity/associated sxs/prior Treatment) Patient is a 80 y.o. female presenting with hypoglycemia. The history is provided by a relative. The history is limited by the condition of the patient (Dementia).  Hypoglycemia She was found by her caregiver unresponsive and EMS noted blood sugar of 26. She was given one amp of D50 and is now back to her baseline level of alertness. Caregiver states that she was placed in hospice 3 months ago and has been generally declining. During this time, she's been eating and drinking very little. Also, she had been started on glimepiride because her blood sugars are running high.  Past Medical History  Diagnosis Date  . Chronic kidney disease (CKD), stage III (moderate)   . Anemia   . Hypertension   . Hyperlipidemia   . Osteoporosis   . Pelvic fracture   . COPD (chronic obstructive pulmonary disease)   . CHF (congestive heart failure)   . Colon polyps   . Abdominal aortic aneurysm   . CAD (coronary artery disease) 2007    s/p MI x 1  . Unspecified deficiency anemia 12/01/2013   Past Surgical History  Procedure Laterality Date  . Bilateral hip fracture s/p repair    . Appendectomy     Family History  Problem Relation Age of Onset  . Cancer Mother     possible colon cancer (not sure)  . Heart attack Father   . Kidney failure Sister   . Heart attack Brother    History  Substance Use Topics  . Smoking status: Never Smoker   . Smokeless tobacco: Never Used  . Alcohol Use: No   OB History   Grav Para Term Preterm Abortions TAB SAB Ect Mult Living                 Review of Systems  Unable to perform ROS: Dementia      Allergies  Codeine; Sulfa antibiotics; and Neosporin  Home Medications   Prior to  Admission medications   Medication Sig Start Date End Date Taking? Authorizing Provider  acetaminophen (TYLENOL) 325 MG tablet Take 650 mg by mouth every 6 (six) hours as needed for pain.    Historical Provider, MD  aspirin 81 MG tablet Take 81 mg by mouth daily.    Historical Provider, MD  cholecalciferol (VITAMIN D) 1000 UNITS tablet Take 1,000 Units by mouth daily.    Historical Provider, MD  ferrous sulfate 325 (65 FE) MG tablet Take 325 mg by mouth daily with breakfast.    Historical Provider, MD  furosemide (LASIX) 40 MG tablet Take 40 mg by mouth 2 (two) times daily.    Historical Provider, MD  isosorbide mononitrate (IMDUR) 60 MG 24 hr tablet Take 60 mg by mouth daily.    Historical Provider, MD  lidocaine (XYLOCAINE) 5 % ointment  11/16/13   Historical Provider, MD  metoprolol tartrate (LOPRESSOR) 25 MG tablet Take 25 mg by mouth 2 (two) times daily.    Historical Provider, MD  nitroGLYCERIN (NITROSTAT) 0.4 MG SL tablet Place 0.4 mg under the tongue every 5 (five) minutes as needed.    Historical Provider, MD  potassium chloride (K-DUR) 10 MEQ tablet Take 10 mEq by mouth 2 (two) times daily.    Historical Provider, MD  BP 147/127  Pulse 84  Temp(Src) 94.3 F (34.6 C) (Rectal)  Resp 18  SpO2 88% Physical Exam  Nursing note and vitals reviewed.  80 year old female, resting comfortably and in no acute distress. Vital signs are significant for hypertension with blood pressure 147/127, and hypothermia with temperature 94.3. Oxygen saturation is 88%, which is hypoxic. She is generally cachectic. Head is normocephalic and atraumatic. PERRLA. Oropharynx is clear. Neck is nontender and supple without adenopathy or JVD. Back is nontender and there is no CVA tenderness. Lungs are clear without rales, wheezes, or rhonchi. Chest is nontender. Heart has regular rate and rhythm without murmur. Abdomen is soft, flat, nontender without masses or hepatosplenomegaly and peristalsis is  normoactive. Extremities have 2+ edema, full range of motion is present. Skin is cool and dry. Scattered areas of senile purpura are present as well as areas of epidermal skin tears with skin loss. End of these appear infected. Neurologic: She is awake and moans when disturbed, but is nonverbal and does not follow commands, cranial nerves are grossly intact, there are no gross motor or sensory deficits.  ED Course  Procedures (including critical care time) Labs Review Results for orders placed during the hospital encounter of 02/24/14  CBC WITH DIFFERENTIAL      Result Value Ref Range   WBC 15.3 (*) 4.0 - 10.5 K/uL   RBC 3.09 (*) 3.87 - 5.11 MIL/uL   Hemoglobin 7.1 (*) 12.0 - 15.0 g/dL   HCT 24.5 (*) 36.0 - 46.0 %   MCV 79.3  78.0 - 100.0 fL   MCH 23.0 (*) 26.0 - 34.0 pg   MCHC 29.0 (*) 30.0 - 36.0 g/dL   RDW 17.8 (*) 11.5 - 15.5 %   Platelets 316  150 - 400 K/uL   Neutrophils Relative % 92 (*) 43 - 77 %   Neutro Abs 14.2 (*) 1.7 - 7.7 K/uL   Lymphocytes Relative 3 (*) 12 - 46 %   Lymphs Abs 0.4 (*) 0.7 - 4.0 K/uL   Monocytes Relative 5  3 - 12 %   Monocytes Absolute 0.8  0.1 - 1.0 K/uL   Eosinophils Relative 0  0 - 5 %   Eosinophils Absolute 0.0  0.0 - 0.7 K/uL   Basophils Relative 0  0 - 1 %   Basophils Absolute 0.0  0.0 - 0.1 K/uL  COMPREHENSIVE METABOLIC PANEL      Result Value Ref Range   Sodium 138  137 - 147 mEq/L   Potassium 4.0  3.7 - 5.3 mEq/L   Chloride 96  96 - 112 mEq/L   CO2 18 (*) 19 - 32 mEq/L   Glucose, Bld 177 (*) 70 - 99 mg/dL   BUN 90 (*) 6 - 23 mg/dL   Creatinine, Ser 2.72 (*) 0.50 - 1.10 mg/dL   Calcium 8.2 (*) 8.4 - 10.5 mg/dL   Total Protein 6.7  6.0 - 8.3 g/dL   Albumin 2.4 (*) 3.5 - 5.2 g/dL   AST 17  0 - 37 U/L   ALT 16  0 - 35 U/L   Alkaline Phosphatase 82  39 - 117 U/L   Total Bilirubin 0.4  0.3 - 1.2 mg/dL   GFR calc non Af Amer 14 (*) >90 mL/min   GFR calc Af Amer 16 (*) >90 mL/min  URINALYSIS, ROUTINE W REFLEX MICROSCOPIC      Result  Value Ref Range   Color, Urine YELLOW  YELLOW   APPearance TURBID (*)  CLEAR   Specific Gravity, Urine 1.010  1.005 - 1.030   pH 6.5  5.0 - 8.0   Glucose, UA NEGATIVE  NEGATIVE mg/dL   Hgb urine dipstick LARGE (*) NEGATIVE   Bilirubin Urine NEGATIVE  NEGATIVE   Ketones, ur NEGATIVE  NEGATIVE mg/dL   Protein, ur 30 (*) NEGATIVE mg/dL   Urobilinogen, UA 0.2  0.0 - 1.0 mg/dL   Nitrite NEGATIVE  NEGATIVE   Leukocytes, UA LARGE (*) NEGATIVE  URINE MICROSCOPIC-ADD ON      Result Value Ref Range   Squamous Epithelial / LPF RARE  RARE   WBC, UA TOO NUMEROUS TO COUNT  <3 WBC/hpf   RBC / HPF 7-10  <3 RBC/hpf   Bacteria, UA MANY (*) RARE  I-STAT CG4 LACTIC ACID, ED      Result Value Ref Range   Lactic Acid, Venous 3.01 (*) 0.5 - 2.2 mmol/L    MDM   Final diagnoses:  Urinary tract infection  Hypothermia  Acute kidney injury  Anemia  Hypoglycemia    Hypoglycemia secondary to combination of oral hypoglycemic agent and generalized deconditioning and poor oral intake. Old records are reviewed and she had been under the care of an oncologist because of skin cancers and a she had been released from his care because of deteriorating condition. She does have DO NOT RESUSCITATE paperwork with her. Screening labs will be obtained and she will be gently warmed. Evaluation will be made 6 for occult urinary tract infection.  Urinalysis shows evidence of UTI and she started on ceftriaxone. Anemia is present slightly worse than recent values. Significant prerenal azotemia is present which is felt to be mainly from dehydration. She has mild elevation of lactic acid. Findings have been discussed with family who like her to be admitted. She's given IV hydration in case has been discussed with Dr. Alcario Drought [facility] who agrees to come and evaluate the patient  Delora Fuel, MD 02/72/53 6644

## 2014-02-24 NOTE — ED Notes (Signed)
Attempted In/Out Cath with no results.  Will re-attempt after bolus of fluid.

## 2014-02-24 NOTE — ED Notes (Signed)
Pt. arrived with EMS from home , family reported that pt. has not been eating and drinking for the last several days with generalized weakness , EMS reported CBG = 26 at home received 1 ampule of D 50% IV prior to arrival , CBG increased to 126 , pt. alert at arrival / respirations unlabored . Pt. is DNR and receiving hospice care at home .

## 2014-02-25 DIAGNOSIS — R6520 Severe sepsis without septic shock: Secondary | ICD-10-CM | POA: Diagnosis present

## 2014-02-25 DIAGNOSIS — T68XXXA Hypothermia, initial encounter: Secondary | ICD-10-CM

## 2014-02-25 DIAGNOSIS — A419 Sepsis, unspecified organism: Secondary | ICD-10-CM | POA: Diagnosis present

## 2014-02-25 DIAGNOSIS — N179 Acute kidney failure, unspecified: Secondary | ICD-10-CM | POA: Diagnosis present

## 2014-02-25 DIAGNOSIS — N183 Chronic kidney disease, stage 3 unspecified: Secondary | ICD-10-CM

## 2014-02-25 DIAGNOSIS — D649 Anemia, unspecified: Secondary | ICD-10-CM

## 2014-02-25 DIAGNOSIS — N390 Urinary tract infection, site not specified: Secondary | ICD-10-CM

## 2014-02-25 DIAGNOSIS — E162 Hypoglycemia, unspecified: Secondary | ICD-10-CM | POA: Diagnosis present

## 2014-02-25 LAB — URINALYSIS, ROUTINE W REFLEX MICROSCOPIC
BILIRUBIN URINE: NEGATIVE
GLUCOSE, UA: NEGATIVE mg/dL
KETONES UR: NEGATIVE mg/dL
Nitrite: NEGATIVE
PH: 6.5 (ref 5.0–8.0)
Protein, ur: 30 mg/dL — AB
Specific Gravity, Urine: 1.01 (ref 1.005–1.030)
Urobilinogen, UA: 0.2 mg/dL (ref 0.0–1.0)

## 2014-02-25 LAB — GLUCOSE, CAPILLARY
GLUCOSE-CAPILLARY: 208 mg/dL — AB (ref 70–99)
GLUCOSE-CAPILLARY: 118 mg/dL — AB (ref 70–99)
GLUCOSE-CAPILLARY: 95 mg/dL (ref 70–99)
Glucose-Capillary: 192 mg/dL — ABNORMAL HIGH (ref 70–99)
Glucose-Capillary: 181 mg/dL — ABNORMAL HIGH (ref 70–99)
Glucose-Capillary: 161 mg/dL — ABNORMAL HIGH (ref 70–99)
Glucose-Capillary: 85 mg/dL (ref 70–99)

## 2014-02-25 LAB — CBC
HCT: 22.2 % — ABNORMAL LOW (ref 36.0–46.0)
Hemoglobin: 6.5 g/dL — CL (ref 12.0–15.0)
MCH: 23.1 pg — AB (ref 26.0–34.0)
MCHC: 29.3 g/dL — AB (ref 30.0–36.0)
MCV: 79 fL (ref 78.0–100.0)
PLATELETS: 299 10*3/uL (ref 150–400)
RBC: 2.81 MIL/uL — ABNORMAL LOW (ref 3.87–5.11)
RDW: 17.8 % — AB (ref 11.5–15.5)
WBC: 12 10*3/uL — AB (ref 4.0–10.5)

## 2014-02-25 LAB — CBC WITH DIFFERENTIAL/PLATELET
Basophils Absolute: 0 10*3/uL (ref 0.0–0.1)
Basophils Relative: 0 % (ref 0–1)
EOS ABS: 0 10*3/uL (ref 0.0–0.7)
Eosinophils Relative: 0 % (ref 0–5)
HCT: 24.5 % — ABNORMAL LOW (ref 36.0–46.0)
Hemoglobin: 7.1 g/dL — ABNORMAL LOW (ref 12.0–15.0)
LYMPHS ABS: 0.4 10*3/uL — AB (ref 0.7–4.0)
Lymphocytes Relative: 3 % — ABNORMAL LOW (ref 12–46)
MCH: 23 pg — AB (ref 26.0–34.0)
MCHC: 29 g/dL — ABNORMAL LOW (ref 30.0–36.0)
MCV: 79.3 fL (ref 78.0–100.0)
Monocytes Absolute: 0.8 10*3/uL (ref 0.1–1.0)
Monocytes Relative: 5 % (ref 3–12)
Neutro Abs: 14.2 10*3/uL — ABNORMAL HIGH (ref 1.7–7.7)
Neutrophils Relative %: 92 % — ABNORMAL HIGH (ref 43–77)
Platelets: 316 10*3/uL (ref 150–400)
RBC: 3.09 MIL/uL — ABNORMAL LOW (ref 3.87–5.11)
RDW: 17.8 % — ABNORMAL HIGH (ref 11.5–15.5)
WBC: 15.3 10*3/uL — AB (ref 4.0–10.5)

## 2014-02-25 LAB — COMPREHENSIVE METABOLIC PANEL
ALBUMIN: 2.4 g/dL — AB (ref 3.5–5.2)
ALK PHOS: 82 U/L (ref 39–117)
ALT: 16 U/L (ref 0–35)
AST: 17 U/L (ref 0–37)
BUN: 90 mg/dL — ABNORMAL HIGH (ref 6–23)
CO2: 18 mEq/L — ABNORMAL LOW (ref 19–32)
Calcium: 8.2 mg/dL — ABNORMAL LOW (ref 8.4–10.5)
Chloride: 96 mEq/L (ref 96–112)
Creatinine, Ser: 2.72 mg/dL — ABNORMAL HIGH (ref 0.50–1.10)
GFR calc non Af Amer: 14 mL/min — ABNORMAL LOW (ref >90)
GFR, EST AFRICAN AMERICAN: 16 mL/min — AB (ref >90)
GLUCOSE: 177 mg/dL — AB (ref 70–99)
Potassium: 4 mEq/L (ref 3.7–5.3)
Sodium: 138 mEq/L (ref 137–147)
TOTAL PROTEIN: 6.7 g/dL (ref 6.0–8.3)
Total Bilirubin: 0.4 mg/dL (ref 0.3–1.2)

## 2014-02-25 LAB — BASIC METABOLIC PANEL
BUN: 84 mg/dL — AB (ref 6–23)
CALCIUM: 7.6 mg/dL — AB (ref 8.4–10.5)
CHLORIDE: 102 meq/L (ref 96–112)
CO2: 17 meq/L — AB (ref 19–32)
CREATININE: 2.39 mg/dL — AB (ref 0.50–1.10)
GFR calc Af Amer: 19 mL/min — ABNORMAL LOW (ref >90)
GFR calc non Af Amer: 16 mL/min — ABNORMAL LOW (ref >90)
GLUCOSE: 173 mg/dL — AB (ref 70–99)
Potassium: 3.7 mEq/L (ref 3.7–5.3)
Sodium: 140 mEq/L (ref 137–147)

## 2014-02-25 LAB — CBG MONITORING, ED: GLUCOSE-CAPILLARY: 40 mg/dL — AB (ref 70–99)

## 2014-02-25 LAB — LACTIC ACID, PLASMA: Lactic Acid, Venous: 3.5 mmol/L — ABNORMAL HIGH (ref 0.5–2.2)

## 2014-02-25 LAB — PREPARE RBC (CROSSMATCH)

## 2014-02-25 LAB — MRSA PCR SCREENING: MRSA by PCR: NEGATIVE

## 2014-02-25 LAB — I-STAT CG4 LACTIC ACID, ED: Lactic Acid, Venous: 3.01 mmol/L — ABNORMAL HIGH (ref 0.5–2.2)

## 2014-02-25 LAB — URINE MICROSCOPIC-ADD ON

## 2014-02-25 MED ORDER — ALPRAZOLAM 0.25 MG PO TABS: 0.2500 mg | ORAL_TABLET | Freq: Every evening | ORAL | Status: DC | PRN

## 2014-02-25 MED ORDER — LIDOCAINE 5 % EX OINT: 1.0000 "application " | TOPICAL_OINTMENT | Freq: Every day | CUTANEOUS | Status: DC | PRN

## 2014-02-25 MED ORDER — METOPROLOL TARTRATE 25 MG PO TABS: 25.0000 mg | ORAL_TABLET | Freq: Two times a day (BID) | ORAL | Status: DC

## 2014-02-25 MED ORDER — ISOSORBIDE MONONITRATE ER 60 MG PO TB24: 60.0000 mg | ORAL_TABLET | Freq: Every day | ORAL | Status: DC

## 2014-02-25 MED ORDER — MORPHINE SULFATE 2 MG/ML IJ SOLN
0.5000 mg | INTRAMUSCULAR | Status: DC | PRN
  Administered 2014-02-25 – 2014-02-26 (×3): 0.5 mg via INTRAVENOUS
  Filled 2014-02-25 (×4): qty 1

## 2014-02-25 MED ORDER — ASPIRIN EC 81 MG PO TBEC
81.0000 mg | DELAYED_RELEASE_TABLET | Freq: Every day | ORAL | Status: DC
  Administered 2014-02-27 – 2014-03-02 (×4): 81 mg via ORAL
  Filled 2014-02-25 (×7): qty 1

## 2014-02-25 MED ORDER — HEPARIN SODIUM (PORCINE) 5000 UNIT/ML IJ SOLN
5000.0000 [IU] | Freq: Three times a day (TID) | INTRAMUSCULAR | Status: DC
  Administered 2014-02-25 – 2014-03-03 (×19): 5000 [IU] via SUBCUTANEOUS
  Filled 2014-02-25 (×22): qty 1

## 2014-02-25 MED ORDER — ASPIRIN 81 MG PO TABS: 81.0000 mg | ORAL_TABLET | Freq: Every day | ORAL | Status: DC

## 2014-02-25 MED ORDER — HALOPERIDOL LACTATE 2 MG/ML PO CONC
2.0000 mg | Freq: Every day | ORAL | Status: DC | PRN
  Filled 2014-02-25: qty 1

## 2014-02-25 MED ORDER — DEXTROSE 5 % IV SOLN
1.0000 g | INTRAVENOUS | Status: DC
  Administered 2014-02-26 – 2014-02-28 (×3): 1 g via INTRAVENOUS
  Filled 2014-02-25 (×3): qty 10

## 2014-02-25 MED ORDER — HYDROCODONE-ACETAMINOPHEN 5-325 MG PO TABS: 0.5000 | ORAL_TABLET | Freq: Four times a day (QID) | ORAL | Status: DC | PRN

## 2014-02-25 MED ORDER — SODIUM CHLORIDE 0.9 % IV SOLN
1000.0000 mL | Freq: Once | INTRAVENOUS | Status: AC
  Administered 2014-02-25: 1000 mL via INTRAVENOUS

## 2014-02-25 MED ORDER — DEXTROSE 5 % IV SOLN
1.0000 g | Freq: Once | INTRAVENOUS | Status: AC
  Administered 2014-02-25: 1 g via INTRAVENOUS
  Filled 2014-02-25: qty 10

## 2014-02-25 MED ORDER — SODIUM CHLORIDE 0.9 % IV SOLN
1000.0000 mL | INTRAVENOUS | Status: DC
  Administered 2014-02-25: 1000 mL via INTRAVENOUS

## 2014-02-25 MED ORDER — DEXTROSE-NACL 5-0.45 % IV SOLN
INTRAVENOUS | Status: DC
  Administered 2014-02-25 – 2014-02-27 (×4): via INTRAVENOUS
  Administered 2014-02-28: 50 mL/h via INTRAVENOUS
  Administered 2014-02-28: 14:00:00 via INTRAVENOUS

## 2014-02-25 MED ORDER — DEXTROSE 50 % IV SOLN
50.0000 mL | Freq: Once | INTRAVENOUS | Status: AC | PRN
  Administered 2014-02-25: 50 mL via INTRAVENOUS

## 2014-02-25 MED ORDER — SODIUM CHLORIDE 0.9 % IJ SOLN
3.0000 mL | Freq: Two times a day (BID) | INTRAMUSCULAR | Status: DC
  Administered 2014-02-27: 3 mL via INTRAVENOUS

## 2014-02-25 MED ORDER — SENNA-DOCUSATE SODIUM 8.6-50 MG PO TABS
1.0000 | ORAL_TABLET | Freq: Every day | ORAL | Status: DC | PRN
  Filled 2014-02-25: qty 1

## 2014-02-25 MED ORDER — DEXTROSE 50 % IV SOLN
25.0000 mL | Freq: Once | INTRAVENOUS | Status: AC | PRN
  Filled 2014-02-25 (×2): qty 50

## 2014-02-25 NOTE — Progress Notes (Signed)
Physician notified: Sarajane Jews At: [phone]  Regarding: Critical HGB at 6.5, no active order to transfuse; could be dilutional? Still OK to transfer to [facility]? Awaiting return response.   Returned Response at: [phone]  Order(s): Type and screen, transfuse 1 unit PRBC, cancel transfer-keep on SDU.

## 2014-02-25 NOTE — ED Notes (Signed)
CBG results reported to [place] by Danne Harbor, EMT

## 2014-02-25 NOTE — Progress Notes (Signed)
PROGRESS NOTE  Sabrina Joyce LFY:[id number] DOB: [birth year] DOA: 02/24/2014 PCP: Antony Blackbird, MD  Summary: 80 year old woman with multiple medical problems, currently on hospice care for end-stage dementia who presented with history of poor oral intake and failure to thrive.  Admitted for severe sepsis secondary to GI with acute renal failure, hypothermia, hypotension  Assessment/Plan: 1. UTI with sepsis, hypothermia, borderline hypotension. 2. Profound hypoglycemia on admission. Presumably secondary to poor oral intake complicated by recent initiation of glimepiride. Now resolved. 3. Anemia of chronic disease with acute component. Aranesp was considered but declined by family. No evidence of ongoing bleeding. 4. Lactic acidosis. Likely secondary to acute renal failure and dehydration. 5. Failure to thrive with very poor oral intake for several days prior to admission. 6. History of skin cancer. 7. Chronic kidney disease stage III. 8. COPD. Appears to be stable. 9. History systolic congestive heart failure with LVEF 40-45% 2007. Significant wall motion abnormalities were seen at that time. 10. History coronary artery disease 11. End stage dementia.   Discussed in detail with daughter by telephone, she confirms CODE STATUS as below. We discussed treatment options including aggressive care, current management and the escalation. At this point plan continue current measures including antibiotics and fluids. No escalation of care, specifically no vasopressors and the patient is DO NOT RESUSCITATE. She understands her mother is critically ill, prognosis guarded.  Plan transfer to medical floor as no escalation of care planned  Monitor for recurrent hypoglycemia  Serial CBC, transfuse for hemoglobin less than 7.  Obtain blood and urine cultures, continue empiric antibiotics  CBC and BMP  Involve hospice, palliative care  Code Status: DNR, under hospice care at home DVT prophylaxis:  hpearin Family Communication: as above Disposition Plan: pending  Murray Hodgkins, MD  [facility]  Pager [phone] If 7PM-7AM, please contact night-coverage at [email or web address], password [facility] 02/25/2014, 7:54 AM  LOS: 1 day   Consultants:    Procedures:    Antibiotics:  Ceftriaxone 6/11 >>   HPI/Subjective: Discussed with RN. Overall patient resting comfortably.  The patient does complain of some pain. However resting comfortably when left alone.  Objective: Filed Vitals:   02/25/14 0318 02/25/14 0400 02/25/14 0502 02/25/14 0640  BP: 100/59 90/50 149/121   Pulse: 91 87 114 97  Temp:  95.3 F (35.2 C) 96.8 F (36 C)   TempSrc:  Rectal Axillary   Resp: 16 16 21 27   Height:   4\' 11"  (1.499 m)   Weight:   33.4 kg (73 lb 10.1 oz)   SpO2:  99% 100% 98%    Intake/Output Summary (Last 24 hours) at 02/25/14 0754 Last data filed at 02/25/14 0600  Gross per 24 hour  Intake   2300 ml  Output      0 ml  Net   2300 ml     Filed Weights   02/25/14 0502  Weight: 33.4 kg (73 lb 10.1 oz)    Exam:   Hypothermic. Temperature 96.8. Borderline hypertensive. Gen. Appears chronically ill, acutely ill but not toxic. Thin. Psych. Follows simple commands, disoriented. Eyes. Pupils equal, round, reactive to light. Normal lids, irises. ENT. Appears grossly unremarkable Neck. Appears grossly unremarkable Cardiovascular. Regular rate and rhythm. No murmur, or gallop. 1+ bilateral lower extremity edema. Respiratory. CTA bilaterally no w/r/r. Normal respiratory effort.  Abdomen. Soft, non-tender, non-distended Skin. Multiple areas of rough skin suggestive of skin cancer. Some chronic wounds lower extremities. Feet are warm and dry with grossly normal perfusion. Musculoskeletal. Moves all  extremities to command. Neurologic. Grossly nonfocal but exam limited.  Data Reviewed: I/O: No urine output recorded. Chemistry: Recurrent hypoglycemia noted. Repeat labs pending. Heme:  Anemic on admission. ID: Unfortunately neither blood nor urine cultures were obtained Imaging: None on admission   Scheduled Meds: . aspirin EC  81 mg Oral Daily  . [START ON 02/26/2014] cefTRIAXone (ROCEPHIN)  IV  1 g Intravenous Q24H  . heparin  5,000 Units Subcutaneous 3 times per day  . sodium chloride  3 mL Intravenous Q12H   Continuous Infusions: . sodium chloride Stopped (02/25/14 0419)  . dextrose 5 % and 0.45% NaCl 150 mL/hr at 02/25/14 9798    Principal Problem:   Severe sepsis with acute organ dysfunction Active Problems:   Chronic kidney disease (CKD), stage III (moderate)   Hypoglycemia   AKI (acute kidney injury)   Sepsis secondary to UTI   Severe sepsis   Time spent 35 minutes

## 2014-02-25 NOTE — Progress Notes (Signed)
Utilization review completed. Arleene Settle, RN, BSN. 

## 2014-02-25 NOTE — Progress Notes (Signed)
Blood consent signed and in chart. Pt has advanced dementia; daughter, Merwyn Katos, consented for blood transfusion by telephone. Witnessed by Pricilla Holm, RN and Katharina Caper, RN.

## 2014-02-25 NOTE — ED Notes (Signed)
MD at bedside. 

## 2014-02-25 NOTE — Progress Notes (Signed)
Nutrition Note  RD reviewed chart for low Braden and positive Malnutrition Screening Tool. Per discussion with RN, patient is currently NPO. Just moans when people enter her room. She is followed by [facility] for end-stage dementia and CHF. No nutrition interventions indicated at this time. Please consult dietitian if nutrition concerns arise.  Molli Barrows, RD, LDN, [place] Pager [phone] After Hours Pager [phone]

## 2014-02-25 NOTE — Progress Notes (Signed)
Pt transferred from ED on monitor with RN and EMT, pt VSS at this time, pt CBG 191 on arrival to unit. Pt has multiple wounds please see assessment. Will continue to monitor.

## 2014-02-25 NOTE — ED Notes (Signed)
Dr. Sheran Luz notified on pt.'s hypothermia /hypotension .

## 2014-02-25 NOTE — ED Notes (Signed)
Attempted to collect urine specimen using straight cath , no urine output .

## 2014-02-25 NOTE — Progress Notes (Signed)
[facility] Work note Patient is currently receiving care under hospice at her home. Her daughter, Horris Latino lives with her and provides 24/7 care and supervision. Patient is DNR. Patient has been declining over the past few months, yet successfully completed respite at [facility] last week. She sleeps most of the day, sitting up in the wheelchair.  Daughter shares concern for patient well-being and hope that UTI will be cleared and blood products will offer patient some benefit. Daughter is aware of patient poor condition and is waiting to see how patient will respond. We talked about returning home for care, [facility] and [facility] as options depending upon patient condition.  Daughter shared stress, sadness with changes and reflected upon past losses- father, brother. LCSW offered support, education and ongoing follow-up. Katherina Right, [place]

## 2014-02-25 NOTE — Consult Note (Addendum)
WOC wound consult note Reason for Consult: Pt with multiple areas of wounds.  Very emaciated and immobile with multiple systemic factors which may impair healing. Wound type: Left heel with dark purple deep tissue injury  3X1cm with skin intact; no odor or drainage.   Posterior back with sttage 1 pressure ulcer where spine is curved and protrudes; difficult to relieve pressure to site; 2X2cm. Left foot near toes deep tissue injury;  2.5X1 cm, no odor or drainage  Pressure Ulcer POA: Yes Middle back with full thickness wound; .5X1X.1cm; 50% yellow, 50% red.  Small amt yellow drainage, no odor. Right 2nd toe with full thickness wound; .3X.3X.1cm, red and moist, small amt yellow drainage, no odor.  Toes contracted and deformed; difficult to reduce pressure to site. Right hip with full thickness wound; 7.5X1cm, 90% yellow slough, 10% red, small amt yellow drainage, no odor. Left inner ankle with full thickness wound; 4X3.5cm, 90% yellow slough, 10% red, small amt yellow drainage, no odor. Left arm partial thickness abrasion 2X1X.1cm; yellow and dry without odor or drainage. Left leg with full thickness wound; 5X1.2X.1cm; yellow and dry without odor or drainage. Right ankle with full thickness wound; 1.5X1.5cm, 100% dry eschar without odor or drainage. Dressing procedure/placement/frequency: Foam dressings to all sites to protect and promote healing.  Moist gauze to left inner ankle and right hip to assist with removal of nonviable tissue. Float heels to reduce pressure. Please re-consult if further assistance is needed.  Thank-you,  Julien Girt MSN, [place], [place], [place], [place]

## 2014-02-25 NOTE — Progress Notes (Signed)
Physician notified: Sarajane Jews At: 0925  Regarding: To obtain Urine culture will need I&O catheter, pt incontinent. On unit.   Order(s): Place standard foley catheter due to diagnosis & increased IVF needs.

## 2014-02-25 NOTE — Consult Note (Signed)
Patient CV:UDTH Sabrina Joyce      DOB: 09-27-1916      YHO:[id number]   Asked to help affirm goals for this hospice patient.  Discussed with hospice social worker and met with patient's daughter.  Patient exhausted and trying to sleep. Currently, daughter expresses that she is ok with the DNR status for her mom but she was not comfortable with the "do  Not  Treat" recommendation.  At this time, she would like to treat the treatable and follow up with team regarding decisions. She is open to transition to hospice facility if current treatments do not bring the desired outcomes.  Emmalou Hunger L. Lovena Le, MD MBA The Palliative Medicine Team at [facility] Phone: [phone] Pager: [phone]  Total time : 20 min

## 2014-02-25 NOTE — H&P (Signed)
[facility] History and Physical  Sabrina Joyce CWC:[id number] DOB: 27-Apr-1917 DOA: 02/24/2014  Referring physician: EDP PCP: Antony Blackbird, MD   Chief Complaint: Hypoglycemia   HPI: Sabrina Joyce is a 80 y.o. female hospice patient at baseline due to end stage dementia.  She was found by caregiver to be unresponsive and EMS noted BGL of 26.  After an amp of D50 she returned to baseline level of alertness.  She was put in hospice 3 months ago and has been declining over that time with poor PO intake.  PO intake has been especially worse over the 2 days.  She was started on glimepiride because of high BGL during her NH stay.  Review of Systems: Unable to perform due to dementia.  Past Medical History  Diagnosis Date  . Chronic kidney disease (CKD), stage III (moderate)   . Anemia   . Hypertension   . Hyperlipidemia   . Osteoporosis   . Pelvic fracture   . COPD (chronic obstructive pulmonary disease)   . CHF (congestive heart failure)   . Colon polyps   . Abdominal aortic aneurysm   . CAD (coronary artery disease) 2007    s/p MI x 1  . Unspecified deficiency anemia 12/01/2013   Past Surgical History  Procedure Laterality Date  . Bilateral hip fracture s/p repair    . Appendectomy     Social History:  reports that she has never smoked. She has never used smokeless tobacco. She reports that she does not drink alcohol or use illicit drugs.  Allergies  Allergen Reactions  . Codeine Nausea Only  . Sulfa Antibiotics Nausea Only  . Neosporin [Neomycin-Polymyxin-Gramicidin] Rash    Family History  Problem Relation Age of Onset  . Cancer Mother     possible colon cancer (not sure)  . Heart attack Father   . Kidney failure Sister   . Heart attack Brother      Prior to Admission medications   Medication Sig Start Date End Date Taking? Authorizing Provider  ALPRAZolam Duanne Moron) 0.25 MG tablet Take 0.25 mg by mouth at bedtime as needed for anxiety or sleep.   Yes  Historical Provider, MD  aspirin 81 MG tablet Take 81 mg by mouth daily.   Yes Historical Provider, MD  furosemide (LASIX) 40 MG tablet Take 40 mg by mouth 2 (two) times daily.   Yes Historical Provider, MD  haloperidol (HALDOL) 2 MG/ML solution Take 2 mg by mouth daily as needed for agitation.   Yes Historical Provider, MD  HYDROcodone-acetaminophen (NORCO/VICODIN) 5-325 MG per tablet Take 0.5-1 tablets by mouth every 6 (six) hours as needed for moderate pain.   Yes Historical Provider, MD  isosorbide mononitrate (IMDUR) 60 MG 24 hr tablet Take 60 mg by mouth daily.   Yes Historical Provider, MD  lidocaine (XYLOCAINE) 5 % ointment Apply 1 application topically daily as needed (for skin lesion).   Yes Historical Provider, MD  metoprolol tartrate (LOPRESSOR) 25 MG tablet Take 25 mg by mouth 2 (two) times daily.   Yes Historical Provider, MD  potassium chloride (K-DUR) 10 MEQ tablet Take 10 mEq by mouth 2 (two) times daily.   Yes Historical Provider, MD  sennosides-docusate sodium (SENOKOT-S) 8.6-50 MG tablet Take 1 tablet by mouth daily as needed for constipation.   Yes Historical Provider, MD  nitroGLYCERIN (NITROSTAT) 0.4 MG SL tablet Place 0.4 mg under the tongue every 5 (five) minutes as needed.    Historical Provider, MD   Physical Exam:  Filed Vitals:   02/25/14 0318  BP: 100/59  Pulse: 91  Temp:   Resp: 16    BP 100/59  Pulse 91  Temp(Src) 94.3 F (34.6 C) (Rectal)  Resp 16  SpO2 98%  General Appearance:    Alert, oriented, no distress except when any exam is attempted, then patient will scream out, appears stated age  Head:    Normocephalic, atraumatic  Eyes:    PERRL, EOMI, sclera non-icteric        Nose:   Nares without drainage or epistaxis. Mucosa, turbinates normal  Throat:   Moist mucous membranes. Oropharynx without erythema or exudate.  Neck:   Supple. No carotid bruits.  No thyromegaly.  No lymphadenopathy.   Back:     No CVA tenderness, no spinal tenderness  Lungs:      Clear to auscultation bilaterally, without wheezes, rhonchi or rales  Chest wall:    No tenderness to palpitation  Heart:    Regular rate and rhythm without murmurs, gallops, rubs  Abdomen:     Soft, non-tender, nondistended, normal bowel sounds, no organomegaly  Genitalia:    deferred  Rectal:    deferred  Extremities:   No clubbing, cyanosis or edema.  Pulses:   2+ and symmetric all extremities  Skin:   Known skin cancer on neck.  Lymph nodes:   Cervical, supraclavicular, and axillary nodes normal  Neurologic:   CNII-XII intact. Normal strength, sensation and reflexes      Throughout. Is verbal for my exam which does appear to be an improvement from Dr. Colin Broach exam.    Labs on Admission:  Basic Metabolic Panel:  Recent Labs Lab 02/25/14 2359  NA 138  K 4.0  CL 96  CO2 18*  GLUCOSE 177*  BUN 90*  CREATININE 2.72*  CALCIUM 8.2*   Liver Function Tests:  Recent Labs Lab 02/25/14 2359  AST 17  ALT 16  ALKPHOS 82  BILITOT 0.4  PROT 6.7  ALBUMIN 2.4*   No results found for this basename: LIPASE, AMYLASE,  in the last 168 hours No results found for this basename: AMMONIA,  in the last 168 hours CBC:  Recent Labs Lab 02/25/14 2359  WBC 15.3*  NEUTROABS 14.2*  HGB 7.1*  HCT 24.5*  MCV 79.3  PLT 316   Cardiac Enzymes: No results found for this basename: CKTOTAL, CKMB, CKMBINDEX, TROPONINI,  in the last 168 hours  BNP (last 3 results) No results found for this basename: PROBNP,  in the last 8760 hours CBG: No results found for this basename: GLUCAP,  in the last 168 hours  Radiological Exams on Admission: No results found.  EKG: Independently reviewed.  Assessment/Plan Principal Problem:   Severe sepsis with acute organ dysfunction Active Problems:   Chronic kidney disease (CKD), stage III (moderate)   Hypoglycemia   AKI (acute kidney injury)   Sepsis secondary to UTI   Severe sepsis   1. Severe sepsis with AKI secondary to UTI - rocephin  ordered, IVF ordered, holding lasix, holding BP meds since she is borderline hypotensive and appears pre-renal on BMP.  Try to get intake and output if possible but given her poor reaction to treatments feel that putting a foley in this patient would be not well tolerated by the patient so will try and avoid for now.  Hypothermic initially so bear hugger in place for warming.  IVF at 100 cc/hr, will see how she does with this but at high risk for fluid overload  given age and baseline CHF (on lasix 40mg  BID at baseline). 2. Hypoglycemia - stopping glipizide, q2h CBG checks and hypoglycemic protocol.  Changing IVF to D5 half.    Code Status: DNR/DNI  Family Communication: Daughter at bedside Disposition Plan: Admit to inpatient   Time spent: 52 min  Deija Buhrman M. [facility] Pager [phone]  If 7AM-7PM, please contact the day team taking care of the patient [email or web address] Password [email or web address] 02/25/2014, 3:52 AM

## 2014-02-25 NOTE — Progress Notes (Addendum)
Inpatient RN visit- [facility] 3S Room 01-[facility] RN Visit-Karen Alford [place] RN  Related admission to [facility] diagnosis of CHF. Pt is DNR  code.  OOF DNR in place in patient home.  Pt seen at bedside, eyes closed, will echo words said to her, unable to answer questions. Pt with several areas of skin breakdown w/foam dressings in place. Staff RN Aileen Pilot and staff tech at bedside for personal care and foley insertion.  Pt cries out when turned, settles with gentle reassurance. Per staff RN Aileen Pilot, pt to receive 1 unit of blood and will remain on 3S at this time.  No family present at time of visit  [facility] will continue to follow through discharge.  Patient's home medication list and transfer summary in place on shadow chart.   Please call [facility] @ [phone]-  with any hospice needs.   Thank you. Tracey Harries, RN  [facility]  Hospice Liaison  [phone])

## 2014-02-26 LAB — BASIC METABOLIC PANEL
BUN: 76 mg/dL — ABNORMAL HIGH (ref 6–23)
CO2: 19 meq/L (ref 19–32)
Calcium: 8.3 mg/dL — ABNORMAL LOW (ref 8.4–10.5)
Chloride: 102 mEq/L (ref 96–112)
Creatinine, Ser: 2.17 mg/dL — ABNORMAL HIGH (ref 0.50–1.10)
GFR calc Af Amer: 21 mL/min — ABNORMAL LOW (ref >90)
GFR calc non Af Amer: 18 mL/min — ABNORMAL LOW (ref >90)
GLUCOSE: 97 mg/dL (ref 70–99)
POTASSIUM: 3.3 meq/L — AB (ref 3.7–5.3)
Sodium: 141 mEq/L (ref 137–147)

## 2014-02-26 LAB — GLUCOSE, CAPILLARY
GLUCOSE-CAPILLARY: 91 mg/dL (ref 70–99)
GLUCOSE-CAPILLARY: 98 mg/dL (ref 70–99)
GLUCOSE-CAPILLARY: 94 mg/dL (ref 70–99)
GLUCOSE-CAPILLARY: 109 mg/dL — AB (ref 70–99)
Glucose-Capillary: 101 mg/dL — ABNORMAL HIGH (ref 70–99)
Glucose-Capillary: 115 mg/dL — ABNORMAL HIGH (ref 70–99)
Glucose-Capillary: 94 mg/dL (ref 70–99)
Glucose-Capillary: 74 mg/dL (ref 70–99)
Glucose-Capillary: 79 mg/dL (ref 70–99)

## 2014-02-26 LAB — CBC
HEMATOCRIT: 28.4 % — AB (ref 36.0–46.0)
HEMOGLOBIN: 8.6 g/dL — AB (ref 12.0–15.0)
MCH: 23.8 pg — AB (ref 26.0–34.0)
MCHC: 30.3 g/dL (ref 30.0–36.0)
MCV: 78.5 fL (ref 78.0–100.0)
Platelets: 245 10*3/uL (ref 150–400)
RBC: 3.62 MIL/uL — AB (ref 3.87–5.11)
RDW: 16.7 % — ABNORMAL HIGH (ref 11.5–15.5)
WBC: 9.6 10*3/uL (ref 4.0–10.5)

## 2014-02-26 MED ORDER — SODIUM CHLORIDE 0.9 % IV SOLN
500.0000 mg | INTRAVENOUS | Status: AC
  Administered 2014-02-26: 500 mg via INTRAVENOUS
  Filled 2014-02-26: qty 500

## 2014-02-26 MED ORDER — POTASSIUM CHLORIDE 20 MEQ/15ML (10%) PO LIQD
40.0000 meq | Freq: Once | ORAL | Status: AC
  Administered 2014-02-26: 40 meq via ORAL
  Filled 2014-02-26: qty 30

## 2014-02-26 NOTE — Progress Notes (Signed)
ANTIBIOTIC CONSULT NOTE - INITIAL  Pharmacy Consult for vancomycin Indication: r/o bacteremia  Allergies  Allergen Reactions  . Codeine Nausea Only  . Sulfa Antibiotics Nausea Only  . Neosporin [Neomycin-Polymyxin-Gramicidin] Rash    Patient Measurements: Height: 4\' 11"  (149.9 cm) Weight: 73 lb 10.1 oz (33.4 kg) IBW/kg (Calculated) : 43.2  Vital Signs: Temp: 97.6 F (36.4 C) (06/12 1221) Temp src: Axillary (06/12 1221) BP: 120/71 mmHg (06/12 1221) Pulse Rate: 101 (06/12 1221) Intake/Output from previous day: 06/11 0701 - 06/12 0700 In: 1846.7 [I.V.:1569.2; Blood:277.5] Out: 600 [Urine:600] Intake/Output from this shift: Total I/O In: -  Out: 550 [Urine:550]  Labs:  Recent Labs  02/24/14 2359 02/25/14 0918 02/26/14 0300  WBC 15.3* 12.0* 9.6  HGB 7.1* 6.5* 8.6*  PLT 316 299 245  CREATININE 2.72* 2.39* 2.17*   Estimated Creatinine Clearance: 8.4 ml/min (by C-G formula based on Cr of 2.17). No results found for this basename: VANCOTROUGH, Corlis Leak, VANCORANDOM, [place], GENTPEAK, GENTRANDOM, TOBRATROUGH, TOBRAPEAK, TOBRARND, AMIKACINPEAK, AMIKACINTROU, AMIKACIN,  in the last 72 hours   Microbiology: Recent Results (from the past 720 hour(s))  MRSA PCR SCREENING     Status: None   Collection Time    02/25/14  5:24 AM      Result Value Ref Range Status   MRSA by PCR NEGATIVE  NEGATIVE Final   Comment:            The GeneXpert MRSA Assay (FDA     approved for NASAL specimens     only), is one component of a     comprehensive MRSA colonization     surveillance program. It is not     intended to diagnose MRSA     infection nor to guide or     monitor treatment for     MRSA infections.  CULTURE, BLOOD (ROUTINE X 2)     Status: None   Collection Time    02/25/14  9:18 AM      Result Value Ref Range Status   Specimen Description BLOOD LEFT ARM   Final   Special Requests     Final   Value: BOTTLES DRAWN AEROBIC AND ANAEROBIC 10CC BLUE 5CC RED   Culture   Setup Time     Final   Value: 02/25/2014 14:38     Performed at [facility]   Culture     Final   Value:        BLOOD CULTURE RECEIVED NO GROWTH TO DATE CULTURE WILL BE HELD FOR 5 DAYS BEFORE ISSUING A FINAL NEGATIVE REPORT     Performed at [facility]   Report Status PENDING   Incomplete  CULTURE, BLOOD (ROUTINE X 2)     Status: None   Collection Time    02/25/14  9:35 AM      Result Value Ref Range Status   Specimen Description BLOOD LEFT HAND   Final   Special Requests BOTTLES DRAWN AEROBIC ONLY 8CC   Final   Culture  Setup Time     Final   Value: 02/25/2014 14:44     Performed at [facility]   Culture     Final   Value: GRAM POSITIVE COCCI IN CLUSTERS     Note: Gram Stain Report Called to,Read Back By and Verified With: LINDA BUN RN EDMOJ     Performed at [facility]   Report Status PENDING   Incomplete  URINE CULTURE     Status: None  Collection Time    02/25/14 10:30 AM      Result Value Ref Range Status   Specimen Description URINE, CATHETERIZED   Final   Special Requests NONE   Final   Culture  Setup Time     Final   Value: 02/25/2014 10:54     Performed at [place] PENDING   Incomplete   Culture     Final   Value: Culture reincubated for better growth     Performed at [facility]   Report Status PENDING   Incomplete    Medical History: Past Medical History  Diagnosis Date  . Chronic kidney disease (CKD), stage III (moderate)   . Anemia   . Hypertension   . Hyperlipidemia   . Osteoporosis   . Pelvic fracture   . COPD (chronic obstructive pulmonary disease)   . CHF (congestive heart failure)   . Colon polyps   . Abdominal aortic aneurysm   . CAD (coronary artery disease) 2007    s/p MI x 1  . Unspecified deficiency anemia 12/01/2013    Medications:  Prescriptions prior to admission  Medication Sig Dispense Refill  . ALPRAZolam (XANAX) 0.25 MG tablet Take 0.25 mg by mouth at bedtime  as needed for anxiety or sleep.      Marland Kitchen aspirin 81 MG tablet Take 81 mg by mouth daily.      . furosemide (LASIX) 40 MG tablet Take 40 mg by mouth 2 (two) times daily.      . haloperidol (HALDOL) 2 MG/ML solution Take 2 mg by mouth daily as needed for agitation.      Marland Kitchen HYDROcodone-acetaminophen (NORCO/VICODIN) 5-325 MG per tablet Take 0.5-1 tablets by mouth every 6 (six) hours as needed for moderate pain.      . isosorbide mononitrate (IMDUR) 60 MG 24 hr tablet Take 60 mg by mouth daily.      Marland Kitchen lidocaine (XYLOCAINE) 5 % ointment Apply 1 application topically daily as needed (for skin lesion).      . metoprolol tartrate (LOPRESSOR) 25 MG tablet Take 25 mg by mouth 2 (two) times daily.      . potassium chloride (K-DUR) 10 MEQ tablet Take 10 mEq by mouth 2 (two) times daily.      . sennosides-docusate sodium (SENOKOT-S) 8.6-50 MG tablet Take 1 tablet by mouth daily as needed for constipation.      . nitroGLYCERIN (NITROSTAT) 0.4 MG SL tablet Place 0.4 mg under the tongue every 5 (five) minutes as needed.       Assessment: 80 y/o female currently on hospice care for end stage dementia who presents with poor oral intake and failure to thrive. Pharmacy consulted to begin vancomycin for r/o bacteremia as 1 of 2 blood cultures with GPC in clusters. She has known CKD with SCr of 2.17. WBC have trended to normal and she is afebrile. Wonder if this could be a contaminant?  Vancomycin 6/12>> Ceftriaxone 6/11>>  6/11 BCx2 - 1 of 2 with GPC clusters 6/11 UCx - reincubated  Goal of Therapy:  Vancomycin trough level 15-20 mcg/ml  Plan:  - Vancomycin 500 mg IV now - Check random vancomycin level Sunday morning, re-dose when level <20 - Monitor renal function and clinical course  [facility], Pharm.D., BCPS Clinical Pharmacist Pager: [phone] 02/26/2014 7:56 PM

## 2014-02-26 NOTE — Progress Notes (Signed)
Inpatient RN visit- Jolane [facility] 12-[facility] RN Visit-Karen Alford [place] RN  Related admission to [facility] diagnosis of CHF.  Pt is DNR code.  OOF DNR in place in patient's home. Pt seen at bedside lying in bed with daughter Horris Latino present. Patient appears to be sleeping, facial muscles relaxed, extremeties relaxed, no nonverbal s/s of pain or discomfort noted. Horris Latino relates that she feels her mother is doing better, pt still has not eaten. Horris Latino also verbalized her desire to take her mother back home, unless she shows further decline/requires more care. Emotional support offered. Pt remained asleep through out visit. Staff RN Vaughan Basta in to check on patient and meet daughter, no concerns voiced. [facility] will continue to follow through discharge. Patient's home medication list and transfer summary in place on shadow chart.  Please call [facility] @ [phone]  RN with any hospice needs.   Thank you. Tracey Harries, RN  [facility]  Hospice Liaison  [phone])

## 2014-02-26 NOTE — Care Management Note (Signed)
    Page 1 of 2   03/03/2014     2:02:42 PM CARE MANAGEMENT NOTE 03/03/2014  Patient:  Sabrina Joyce, Sabrina Joyce   Account Number:  [id number]  Date Initiated:  02/26/2014  Documentation initiated by:  Marvetta Gibbons  Subjective/Objective Assessment:   Pt admitted with hypoglycemia and sepsis     Action/Plan:   PTA pt lived at home with daughter- Horris Latino- has home Hospice with [facility]- pending pt progress may need skilled vs residential hospice vs return home- NCM to follow   Anticipated DC Date:  03/03/2014   Anticipated DC Plan:  [place] referral  Clinical Social Worker      DC Planning Services  CM consult      [facility] Choice  HOSPICE   Choice offered to / List presented to:  C-1 Patient           Status of service:  Completed, signed off Medicare Important Message given?   (If response is "NO", the following Medicare IM given date fields will be blank) Date Medicare IM given:   Date Additional Medicare IM given:    Discharge Disposition:  [place]  Per UR Regulation:  Reviewed for med. necessity/level of care/duration of stay  If discussed at [place] of Stay Meetings, dates discussed:    Comments:  03/03/14 [place], BSN [phone] patient dc to [facility] today, CSW following.  03/02/14 [place], BSN [phone] per CSW , MD states family now wants [facility], instead of home with hospice.  NCM spoke with daughte, [place] while she was in patient's roomr and she states that she can not take care of patient at home any longer and she would like for patient to go to [facility].  Originally per attending on 6/15 plan was for patient to go home with hospice today, and now the daughter is stating it is too much for her she is the only care giver and she can not handle it.  Per Dr. Sloan Leiter states patient will be ready for dc tomorrow 03/03/14 for Residential Hospice,  NCM explained to [facility] that tomorrow if there  is a bed available in [place] and not one at [facility] then she will have to make a choice to go to [place] or to a snf or home with Hospice , she states she understood.  I informed her that we hoping there is a bed a Office manager.

## 2014-02-26 NOTE — Progress Notes (Addendum)
Report called to 5W RN. Daughter, Horris Latino, notified of transfer of rooms. VSS. eICU and CMT notified of transfer, will transfer by bed. Continue to monitor until time of transfer.   1145 Pt transferred by NT via bed. No tele.

## 2014-02-26 NOTE — Progress Notes (Signed)
PROGRESS NOTE  Sabrina Joyce:[id number] DOB: February 21, 1917 DOA: 02/24/2014 PCP: Antony Blackbird, MD  Summary: 80 year old woman with multiple medical problems, currently on hospice care for end-stage dementia who presented with history of poor oral intake and failure to thrive.  Admitted for severe sepsis secondary to GI with acute renal failure, hypothermia, hypotension  Assessment/Plan: 1. UTI with sepsis, hypothermia, borderline hypotension. Improving. 2. Profound hypoglycemia on admission. Appears  resolved at this point. Presumably secondary to poor oral intake complicated by recent initiation of glimepiride.  3. Anemia of chronic disease with acute anemia of critical illness. Hemoglobin improved status post 1 unit packed red blood cells. Aranesp was considered but declined by family. No evidence of ongoing bleeding. 4. Lactic acidosis. Likely secondary to acute renal failure and dehydration. Metabolic acidosis appears to have resolved. 5. Failure to thrive with very poor oral intake for several days prior to admission. 6. History of skin cancer. 7. Chronic kidney disease stage III. 8. COPD. Appears to be stable. 9. History systolic congestive heart failure with LVEF 40-45% 2007. Significant wall motion abnormalities were seen at that time. No evidence of volume overload. 10. History coronary artery disease 11. End stage dementia. Stable.   Blood pressure is stabilized, euthermic. Debilitated with guarded prognosis but appears to stabilized.  Plan transfer to medical floor, continue empiric antibiotics, followup culture data  Replete potassium  Discussed plan with daughter by telephone  Code Status: DNR, under hospice care at home DVT prophylaxis: heparin Family Communication: as above Disposition Plan: pending  Murray Hodgkins, MD  [facility]  Pager [phone] If 7PM-7AM, please contact night-coverage at [email or web address], password [facility] 02/26/2014, 7:41 AM  LOS: 2 days     Consultants:  Palliative medicine  Procedures:  Transfusion one unit packed red blood cells 6/11  Antibiotics:  Ceftriaxone 6/11 >>   HPI/Subjective: Patient feels well. No complaints.  Objective: Filed Vitals:   02/25/14 1624 02/25/14 1924 02/26/14 0013 02/26/14 0347  BP: 98/30 112/61 123/95 109/59  Pulse:  94 95 88  Temp: 97.5 F (36.4 C) 97.3 F (36.3 C) 97.2 F (36.2 C) 97.8 F (36.6 C)  TempSrc: Axillary Axillary Axillary Axillary  Resp:  18 15   Height:      Weight:      SpO2:  99% 95% 97%    Intake/Output Summary (Last 24 hours) at 02/26/14 0741 Last data filed at 02/26/14 0600  Gross per 24 hour  Intake 1846.66 ml  Output    600 ml  Net 1246.66 ml     Filed Weights   02/25/14 0502  Weight: 33.4 kg (73 lb 10.1 oz)    Exam:   Euthermic. Normotensive. No hypoxia. Gen. Appears calm, comfortable. Lying flat in bed. Psych. Follows commands. Appears confused. Eyes. Appear grossly unremarkable Cardiovascular. Regular rate and rhythm. No murmur, rub or gallop. No lower extremity edema. Telemetry sinus rhythm. Respiratory. Clear to auscultation bilaterally. No wheezes, rales or rhonchi. Normal respiratory effort. Abdomen. Soft nontender, nondistended. Skin. No significant changes noted Musculoskeletal. Grossly unremarkable  Data Reviewed: I/O: +3.6 L since admission. Urine output 600+ Chemistry: Capillary blood sugars stable. No recurrent hypoglycemia. BUN and creatinine continuing to trend downwards. Potassium 3.3. Heme: Hemoglobin 8.6 status post transfusion, leukocytosis resolved ID: Blood and urine cultures pending but were obtained after initiation of antibiotics  Scheduled Meds: . aspirin EC  81 mg Oral Daily  . cefTRIAXone (ROCEPHIN)  IV  1 g Intravenous Q24H  . heparin  5,000 Units Subcutaneous 3 times per  day  . sodium chloride  3 mL Intravenous Q12H   Continuous Infusions: . dextrose 5 % and 0.45% NaCl 50 mL/hr at 02/25/14 2030     Principal Problem:   Severe sepsis with acute organ dysfunction Active Problems:   Chronic kidney disease (CKD), stage III (moderate)   Hypoglycemia   AKI (acute kidney injury)   Sepsis secondary to UTI   Severe sepsis   Time spent 20 minutes

## 2014-02-26 NOTE — Clinical Documentation Improvement (Signed)
Possible Clinical Conditions?  Severe Malnutrition   Protein Calorie Malnutrition Severe Protein Calorie Malnutrition Emaciation  Cachexia    Other Condition Cannot clinically determine  Supporting Information:Failure to thrive with very poor oral intake for several days prior to admission, BMI=14.9 Ht: 4'11"  Wt: 73 lbs. Risk Factors:End stage dementia, FTT, hypoglycemia  Signs & Symptoms:WOC wound consult note 02/25/2014 Pt with multiple areas of wounds. Very emaciated and immobile with multiple systemic factors which may impair healing.  02/25/14 Nutritional Management: low Braden and positive Malnutrition Screening Tool.   Treatment: Skin care using foam dressing,   Re-position , NPO  Thank You, Philippa Chester ,RN Clinical Documentation Specialist:  [place] Information Management

## 2014-02-26 NOTE — Progress Notes (Signed)
9:27AM attempted to received report from RN at 3S. RN doing patient care - will call back.  10:53 AM - report received from [place] at 3S for patient to be transferred into 5W12.

## 2014-02-27 ENCOUNTER — Inpatient Hospital Stay (HOSPITAL_COMMUNITY)

## 2014-02-27 DIAGNOSIS — N390 Urinary tract infection, site not specified: Secondary | ICD-10-CM

## 2014-02-27 LAB — GLUCOSE, CAPILLARY
GLUCOSE-CAPILLARY: 71 mg/dL (ref 70–99)
GLUCOSE-CAPILLARY: 93 mg/dL (ref 70–99)
GLUCOSE-CAPILLARY: 106 mg/dL — AB (ref 70–99)
Glucose-Capillary: 102 mg/dL — ABNORMAL HIGH (ref 70–99)

## 2014-02-27 NOTE — Progress Notes (Addendum)
PROGRESS NOTE  Sabrina Joyce YEL:[id number] DOB: 1916-10-04 DOA: 02/24/2014 PCP: Antony Blackbird, MD  Summary: 80 year old woman with multiple medical problems, currently on hospice care for end-stage dementia who presented with history of poor oral intake and failure to thrive.  Admitted for severe sepsis secondary to GI with acute renal failure, hypothermia, hypotension  Assessment/Plan: 1. UTI with sepsis, hypothermia, borderline hypotension. Hypotension has resolved. Urine culture growing Staph aureus, note this culture was obtained after starting Rocephin, cannot exclude other organisms. 2. Bacteremia. Possibly contaminant. Check 2-D echocardiogram, followup culture results. 3. Profound hypoglycemia on admission. Resolved at this point. Presumably secondary to poor oral intake complicated by recent initiation of glimepiride.  4. Anemia of chronic disease with acute anemia of critical illness. Hemoglobin improved status post 1 unit packed red blood cells. Aranesp was considered but declined by family. No evidence of ongoing bleeding. 5. Lactic acidosis. Likely secondary to acute renal failure and dehydration. Metabolic acidosis resolved. 6. Failure to thrive with very poor oral intake for several days prior to admission. 7. History of skin cancer. 8. Chronic kidney disease stage III. 9. COPD. Appears stable. 10. History systolic congestive heart failure with LVEF 40-45% 2007. Significant wall motion abnormalities were seen at that time. No evidence of volume overload. Her affect appears clinically dry. 11. History coronary artery disease. Appears stable. 12. End stage dementia. Appears stable.   Continue empiric antibiotics for UTI with sepsis as well as vancomycin for possible bacteremia versus contaminant. Both blood culture and urine culture were obtained after the start of Rocephin, therefore continue Rocephin.  Discussed with daughter by telephone, clinical status appears stable  although prognosis is guarded.  Code Status: DNR, under hospice care at home DVT prophylaxis: heparin Family Communication: as above Disposition Plan: pending  Murray Hodgkins, MD  [facility]  Pager [phone] If 7PM-7AM, please contact night-coverage at [email or web address], password [facility] 02/27/2014, 10:59 AM  LOS: 3 days   Consultants:  Palliative medicine  Procedures:  Transfusion one unit packed red blood cells 6/11  Antibiotics:  Ceftriaxone 6/11 >>   Vancomycin 6/12 >>   HPI/Subjective: Patient denies complaints but history is unreliable secondary to dementia.   Blood cultures notable for gram-positive cocci in one bottle overnight, vancomycin started.  Somewhat increased work of breathing per Dr. Lovena Le.  Objective: Filed Vitals:   02/26/14 0800 02/26/14 1221 02/26/14 2004 02/27/14 0525  BP: 115/50 120/71 127/67 116/73  Pulse: 84 101 82 86  Temp:  97.6 F (36.4 C) 98.1 F (36.7 C) 98.9 F (37.2 C)  TempSrc:  Axillary Oral Axillary  Resp: 14 20 15 15   Height:      Weight:      SpO2: 99%  95% 99%    Intake/Output Summary (Last 24 hours) at 02/27/14 1059 Last data filed at 02/27/14 1039  Gross per 24 hour  Intake      0 ml  Output   1575 ml  Net  -1575 ml     Filed Weights   02/25/14 0502  Weight: 33.4 kg (73 lb 10.1 oz)    Exam:  AF, VSS. No hypoxia Gen. Appears calm and comfortable. Appears debilitated, acute and chronically ill. Psych. Confused. Alert. Follows simple commands. Cardiovascular. Regular rate and rhythm. No murmur, rub or gallop. No lower extremity edema. Respiratory. Clear to auscultation bilaterally. Good air movement. No wheezes, rales or rhonchi. Mild to moderate increased respiratory effort Abdomen. Soft Skin. Lower extremity wounds bandaged. Multiple areas suggestive of actinic keratosis or skin cancer.  Musculoskeletal. Moves all extremities. Neurologic. Grossly nonfocal.  Data Reviewed: I/O: +1.921 L since  admission. Urine output 1475+ Chemistry: Capillary blood sugars stable. ID: Blood and urine cultures pending but were obtained after initiation of antibiotics. Urine culture with staph aureus. Blood cultures 1/2 gram-positive cocci in clusters.  Scheduled Meds: . aspirin EC  81 mg Oral Daily  . cefTRIAXone (ROCEPHIN)  IV  1 g Intravenous Q24H  . heparin  5,000 Units Subcutaneous 3 times per day  . sodium chloride  3 mL Intravenous Q12H   Continuous Infusions: . dextrose 5 % and 0.45% NaCl 50 mL/hr at 02/26/14 1426    Principal Problem:   Severe sepsis with acute organ dysfunction Active Problems:   Chronic kidney disease (CKD), stage III (moderate)   Hypoglycemia   AKI (acute kidney injury)   Sepsis secondary to UTI   Severe sepsis   Time spent 20 minutes

## 2014-02-27 NOTE — Progress Notes (Signed)
Related admission to [facility] diagnosis of CHF.  DNR on chart.  Found patient resting in bed, no family present.  FLACC score 0.  Reviewed chart and patient continues to receive Rocephin and Vancomycin for sepsis.  Continue current plan of care and anticipate discharge home with continued Hospice services.  Encourage a call to Hospice at [phone] with any needs.  April Vinetta Bergamo, RN, BSN Hospice

## 2014-02-27 NOTE — Progress Notes (Signed)
Patient [name]      DOB: [birth year]      RAQ:[id number]   Palliative Medicine Team at [facility] Progress Note    Subjective: Sabrina Joyce is awake and talking some today. She appears slightly tachypneaic.  She relates that she is hurting on her bottom. Sabrina Joyce not at bedside.  Patient is pleasantly confused    Filed Vitals:   02/27/14 0525  BP: 116/73  Pulse: 86  Temp: 98.9 F (37.2 C)  Resp: 15   Physical exam:  General: more awake but not opening eyes when she talks with me. 2 PERRL,, lids puffy, mm dry Chest: decreased, with some increased work of breathing, crackles in the up airways, no wheezing CVs: regular, S1, S2 Abd: soft, not tender or distended. Ext: frail.  Neuro: confused  Lab Results  Component Value Date   CREATININE 2.17* 02/26/2014   BUN 76* 02/26/2014   NA 141 02/26/2014   K 3.3* 02/26/2014   CL 102 02/26/2014   CO2 19 02/26/2014    Assessment and plan: 80 yr old female on Hospice care admitted with altered mentation found to have UTi and anemia.  Daughter desires to treat the treatable but recognizes her as DNR.  Patient received blood products yesterday and is more awake and alert.  She is off her home diuretics.  1.  DNR  2.  Increased respiratory work: discussed with Dr. Sarajane Jews.  Will check a chest xray for chf vs pneumonia .  3.  UTI- continue antibioitics   Total time:  15 min [phone]   Addendum: reviewed chest xray.  Spoke with nurse to receive updated exam.  Patient sleeping comfortably without increased work of breathing.  She may have slight congestion per nurse.  Plan to [facility] IVF and reeval in am.  I asked the nurse to resassess over the course of the night if she develops worsened breathing or secretions I have asked her to have the night team assess her for a dose of lasix.  Xray notes possible fracture of left humerus.  I do not recall seeing deformity nor has she complained of pain.  I alert the nursing staff to report any new pain  and have the night team eval if needed.  Will reeval ASAP in the am.   Sabrina Joyce L. Lovena Le, MD MBA The Palliative Medicine Team at [facility] Phone: [phone] Pager: [phone]

## 2014-02-27 NOTE — Progress Notes (Signed)
NURSING PROGRESS NOTE  RANDYE TREICHLER [id number] Transfer Data: 02/27/2014 3:00 PM Attending Provider: Samuella Cota, MD HTD:SKAJ, [name], MD Code Status: DNR   Sabrina Joyce is a 80 y.o. female patient transferred from 3S -No acute distress noted.  -No complaints of shortness of breath.  -No complaints of chest pain.    Blood pressure 116/73, pulse 86, temperature 98.9 F (37.2 C), temperature source Axillary, resp. rate 15, height 4\' 11"  (1.499 m), weight 33.4 kg (73 lb 10.1 oz), SpO2 99.00%.   IV Fluids:  IV in place, occlusive dsg intact without redness, IV cath forearm right, condition patent and no redness D5W/0.45 NaCl.   Allergies:  Codeine; Sulfa antibiotics; and Neosporin  Past Medical History:   has a past medical history of Chronic kidney disease (CKD), stage III (moderate); Anemia; Hypertension; Hyperlipidemia; Osteoporosis; Pelvic fracture; COPD (chronic obstructive pulmonary disease); CHF (congestive heart failure); Colon polyps; Abdominal aortic aneurysm; CAD (coronary artery disease) (2007); and Unspecified deficiency anemia (12/01/2013).  Past Surgical History:   has past surgical history that includes bilateral hip fracture s/p repair and Appendectomy.   Skin: Multiple wounds to patient's body. Patient has growth from skin cancer throughout body and face. Per Daughter, pt has hx of skin cancer and has been regularly seeing dermatologist. Daughter states that wounds are from "dermatologist working on it" All wounds have been assessed and covered per MD order. All wounds are documented in "assessment." Bruises noted to bilateral arms and bilateral lower extremities.  Family orientated to room. Information packet given to patient/family. Admission inpatient armband information verified with family to include name and date of birth and placed on patient arm. Side rails up x 2, fall assessment and education completed with family. Family able to verbalize understanding of  risk associated with falls and verbalized understanding to call for assistance before getting out of bed. Call light within reach. Family able to voice and demonstrate understanding of unit orientation instructions.    Will continue to evaluate and treat per MD orders.  Wallie Renshaw, RN

## 2014-02-28 LAB — CULTURE, BLOOD (ROUTINE X 2)

## 2014-02-28 LAB — URINE CULTURE: Colony Count: 35000

## 2014-02-28 LAB — GLUCOSE, CAPILLARY
GLUCOSE-CAPILLARY: 87 mg/dL (ref 70–99)
GLUCOSE-CAPILLARY: 146 mg/dL — AB (ref 70–99)
Glucose-Capillary: 61 mg/dL — ABNORMAL LOW (ref 70–99)
Glucose-Capillary: 64 mg/dL — ABNORMAL LOW (ref 70–99)
Glucose-Capillary: 130 mg/dL — ABNORMAL HIGH (ref 70–99)
Glucose-Capillary: 157 mg/dL — ABNORMAL HIGH (ref 70–99)

## 2014-02-28 LAB — BASIC METABOLIC PANEL
BUN: 54 mg/dL — ABNORMAL HIGH (ref 6–23)
CHLORIDE: 103 meq/L (ref 96–112)
CO2: 19 meq/L (ref 19–32)
CREATININE: 1.54 mg/dL — AB (ref 0.50–1.10)
Calcium: 8.1 mg/dL — ABNORMAL LOW (ref 8.4–10.5)
GFR calc non Af Amer: 28 mL/min — ABNORMAL LOW (ref >90)
GFR, EST AFRICAN AMERICAN: 32 mL/min — AB (ref >90)
Glucose, Bld: 64 mg/dL — ABNORMAL LOW (ref 70–99)
Potassium: 2.3 mEq/L — CL (ref 3.7–5.3)
Sodium: 143 mEq/L (ref 137–147)

## 2014-02-28 LAB — CBC
HCT: 31.4 % — ABNORMAL LOW (ref 36.0–46.0)
Hemoglobin: 9.5 g/dL — ABNORMAL LOW (ref 12.0–15.0)
MCH: 23.9 pg — ABNORMAL LOW (ref 26.0–34.0)
MCHC: 30.3 g/dL (ref 30.0–36.0)
MCV: 78.9 fL (ref 78.0–100.0)
PLATELETS: 232 10*3/uL (ref 150–400)
RBC: 3.98 MIL/uL (ref 3.87–5.11)
RDW: 17.5 % — AB (ref 11.5–15.5)
WBC: 16.8 10*3/uL — AB (ref 4.0–10.5)

## 2014-02-28 LAB — PHOSPHORUS: PHOSPHORUS: 4.2 mg/dL (ref 2.3–4.6)

## 2014-02-28 LAB — MAGNESIUM: Magnesium: 1.5 mg/dL (ref 1.5–2.5)

## 2014-02-28 LAB — VANCOMYCIN, RANDOM: Vancomycin Rm: 6.8 ug/mL

## 2014-02-28 MED ORDER — VANCOMYCIN HCL 500 MG IV SOLR
500.0000 mg | Freq: Once | INTRAVENOUS | Status: DC
  Administered 2014-02-28: 500 mg via INTRAVENOUS
  Filled 2014-02-28: qty 500

## 2014-02-28 MED ORDER — POTASSIUM CHLORIDE 10 MEQ/100ML IV SOLN
10.0000 meq | INTRAVENOUS | Status: AC
  Administered 2014-02-28 (×6): 10 meq via INTRAVENOUS
  Filled 2014-02-28 (×6): qty 100

## 2014-02-28 MED ORDER — KCL IN DEXTROSE-NACL 20-5-0.45 MEQ/L-%-% IV SOLN
INTRAVENOUS | Status: DC
  Administered 2014-02-28: 20:00:00 via INTRAVENOUS
  Filled 2014-02-28 (×2): qty 1000

## 2014-02-28 MED ORDER — MAGNESIUM SULFATE 40 MG/ML IJ SOLN
2.0000 g | Freq: Once | INTRAMUSCULAR | Status: AC
  Administered 2014-02-28: 2 g via INTRAVENOUS
  Filled 2014-02-28: qty 50

## 2014-02-28 MED ORDER — LEVOFLOXACIN IN D5W 500 MG/100ML IV SOLN
500.0000 mg | INTRAVENOUS | Status: DC
  Administered 2014-03-01: 500 mg via INTRAVENOUS
  Filled 2014-02-28 (×2): qty 100

## 2014-02-28 NOTE — Progress Notes (Signed)
ANTIBIOTIC CONSULT NOTE - FOLLOW UP  Pharmacy Consult for Levaquin Indication: UTI  Allergies  Allergen Reactions  . Codeine Nausea Only  . Sulfa Antibiotics Nausea Only  . Neosporin [Neomycin-Polymyxin-Gramicidin] Rash    Patient Measurements: Height: 4\' 11"  (149.9 cm) Weight: 73 lb 10.1 oz (33.4 kg) IBW/kg (Calculated) : 43.2  Vital Signs: Temp: 97.4 F (36.3 C) (06/14 0555) Temp src: Axillary (06/14 0555) BP: 125/67 mmHg (06/14 0555) Pulse Rate: 112 (06/14 0555) Intake/Output from previous day: 06/13 0701 - 06/14 0700 In: 20 [P.O.:20] Out: 1000 [Urine:1000] Intake/Output from this shift: Total I/O In: 2548 [P.O.:240; I.V.:2208; IV Piggyback:100] Out: -   Labs:  Recent Labs  02/26/14 0300 02/28/14 0550  WBC 9.6 16.8*  HGB 8.6* 9.5*  PLT 245 232  CREATININE 2.17* 1.54*   Estimated Creatinine Clearance: 11.8 ml/min (by C-G formula based on Cr of 1.54).  Recent Labs  02/28/14 0550  VANCORANDOM 6.8    Assessment: 89yof started on vancomycin and ceftriaxone for possible bacteremia. Blood cultures grew 1/2 coag negative staph. Spoke to Dr. Sarajane Jews and this is likely a contaminant. Urine culture grew staph aureus. Antibiotics will be de-escalated to levaquin. Renal function continues to improve.   Vancomycin 6/12>>6/14 Ceftriaxone 6/11>>6/14 Levaquin 6/14>>  6/11 BCx2 - 1 of 2 with GPC clusters>>coag neg staph 6/11 UCx - staph aureus 35K (PCN resistant, sensitive to everything else)  Goal of Therapy:  Appropriate levaquin dosing  Plan:  1) Levaquin 500mg  IV q48 - will not give the 750mg  loading dose as she already received 500mg  of vancomycin today 2) Continue to follow renal function, LOT  Deboraha Sprang 02/28/2014,12:20 PM

## 2014-02-28 NOTE — Progress Notes (Signed)
CRITICAL VALUE ALERT  Critical value received:  Blood glucose 61   Date of notification:  6/14  Time of notification:  0800  Critical value read back:yes  Nurse who received alert:  Rip Harbour  MD notified (1st page):  Sarajane Jews, Keturah Barre.  Time of first page:  0800  MD notified (2nd page):  Time of second page:  Responding MD:  Sarajane Jews  Time MD responded:  [phone]

## 2014-02-28 NOTE — Progress Notes (Signed)
CRITICAL VALUE ALERT  Critical value received: Potassium of 2.3  Date of notification:  02/28/14  Time of notification:  0708  Critical value read back:yes  Nurse who received alert:  Larose Kells, RN  MD notified (1st page):  Sarajane Jews  Time of first page:  0710  MD notified (2nd page):   Time of second page:  Responding MD:  Sarajane Jews  Time MD responded:  6967  Pt to get runs of K IV

## 2014-02-28 NOTE — Progress Notes (Signed)
Related admission to [facility] diagnosis of CHF.  DNR on chart.  Found patient resting in bed, no family present.  FLACC score 0.  Reviewed chart and patient received 2D Echo for evaluation of bacteremia.  Patient continues to receive broad spectrum antibiotics for sepsis.  Patient's daughter has been updated on patient's prognosis and comfort care has been discussed as patient's prognosis is poor.  Continue current plan of care and anticipate discharge home with continued Hospice services.  Encourage a call to Hospice at [phone] with any needs.  April Vinetta Bergamo, RN, BSN Hospice

## 2014-02-28 NOTE — Progress Notes (Signed)
PROGRESS NOTE  Sabrina Joyce IRW:[id number] DOB: 1917-09-09 DOA: 02/24/2014 PCP: Antony Blackbird, MD  Summary: 80 year old woman with multiple medical problems, currently on hospice care for end-stage dementia who presented with history of poor oral intake and failure to thrive.  Admitted for severe sepsis secondary to GI with acute renal failure, hypothermia, hypotension  Assessment/Plan: 1. UTI with sepsis, hypothermia, borderline hypotension. Hypotension has resolved. Urine culture growing Staph aureus, note this culture was obtained after starting Rocephin, cannot exclude other organisms. 2. Bacteremia. Possibly contaminant. Followup 2-D echocardiogram, followup culture results. 3. Profound hypoglycemia on admission. Recurrent secondary to very poor oral intake. Presumably secondary to poor oral intake complicated by recent initiation of glimepiride.  4. Anemia of chronic disease with acute anemia of critical illness. Hemoglobin stable status post 1 unit packed red blood cells. Aranesp was considered in the past but declined by family. No evidence of ongoing bleeding. 5. Profound hypokalemia, secondary to poor oral intake. 6. Lactic acidosis. Likely secondary to acute renal failure and dehydration. Metabolic acidosis resolved. 7. Failure to thrive with very poor oral intake 8. Advanced left shoulder degenerative osteoarthritis, left shoulder film April 2009 demonstrated extensive sclerosis, joint space narrowing, bone on bone contact of the left shoulder. No malalignment or fracture at that time. However compared to most recent chest x-ray 09/30/2010, suspect progression of degeneration rather than acute fracture. 9. Chronic kidney disease stage III. 10. COPD. Appears stable. 11. History systolic congestive heart failure with LVEF 40-45% 2007. Significant wall motion abnormalities were seen at that time. No evidence of volume overload clinically, respiratory status stable. Appears clinically  dry. No 12. History coronary artery disease. Stable. 13. End stage dementia. Stable.   Continue empiric antibiotics, followup culture data and 2-D echocardiogram.  Continue IV fluids, repeat basic metabolic panel and CBC in the morning. No clinical evidence of decompensated heart failure.  Failure to thrive, minimal oral intake minimal. Prognosis poor. Suspect will not recover.  Replace potassium, magnesium borderline, replace.  Updated daughter by telephone. We discussed current issues and treatment plan as well as shoulder x-ray (patient has a long history of left shoulder pain) we discussed she would not be a candidate for operative management regardless, however I do not suspect fracture but rather progressive degeneration of the shoulder. Do think the likelihood of recovery is very poor secondary to failure to thrive and poor nutritional status. Daughter understands this and appears to have realistic outlook. Plan to continue current treatments for now but we did start the discussion today of consideration of comfort measures in the future.  Code Status: DNR, under hospice care at home DVT prophylaxis: heparin Family Communication: as above Disposition Plan: pending  Murray Hodgkins, MD  [facility]  Pager [phone] If 7PM-7AM, please contact night-coverage at [email or web address], password [facility] 02/28/2014, 8:04 AM  LOS: 4 days   Consultants:  Palliative medicine  Procedures:  Transfusion one unit packed red blood cells 6/11  Antibiotics:  Ceftriaxone 6/11 >>   Vancomycin 6/12 >>   HPI/Subjective: Patient without complaints but history is unreliable. The episode of hypoglycemia this morning. Minimal oral intake.  Objective: Filed Vitals:   02/27/14 0525 02/27/14 1500 02/27/14 2106 02/28/14 0555  BP: 116/73 121/62 108/64 125/67  Pulse: 86 85 98 112  Temp: 98.9 F (37.2 C) 98.2 F (36.8 C) 97.5 F (36.4 C) 97.4 F (36.3 C)  TempSrc: Axillary Axillary Axillary  Axillary  Resp: 15 16 16 16   Height:      Weight:  SpO2: 99% 99% 100% 100%    Intake/Output Summary (Last 24 hours) at 02/28/14 0804 Last data filed at 02/28/14 0734  Gross per 24 hour  Intake   2139 ml  Output   1000 ml  Net   1139 ml     Filed Weights   02/25/14 0502  Weight: 33.4 kg (73 lb 10.1 oz)    Exam:  AF, VSS. No hypoxia Gen. Appears calm, chronically ill, debilitated, nontoxic. Psych. Alert but not able to consistently follow commands. Cardiovascular. Regular rate and rhythm. No murmur, rub or gallop. No lower extremity edema. Respiratory. Clear to auscultation bilaterally. No wheezes, rales or rhonchi. Normal respiratory effort. Abdomen. Soft Skin. Multiple lesions consistent with skin cancer or significant actinic keratoses. Musculoskeletal. Left shoulder and arm somewhat contracted, patient reluctant to have shoulder and left arm manipulated.  Data Reviewed: I/O: Urine output 1 L, minimal oral intake, BM x1 Chemistry: Potassium 2.3. BUN and creatinine slowly trending downwards. Heme: Hemoglobin improved, 9.5. ID: Urine culture staph aureus, results pending. Blood cultures 1/2 bottles with gram-positive cocci, pending Imaging: Chest x-ray with possible deformity of the proximal left humerus, cannot exclude mild CHF  Scheduled Meds: . aspirin EC  81 mg Oral Daily  . cefTRIAXone (ROCEPHIN)  IV  1 g Intravenous Q24H  . heparin  5,000 Units Subcutaneous 3 times per day  . potassium chloride  10 mEq Intravenous Q1 Hr x 6  . sodium chloride  3 mL Intravenous Q12H   Continuous Infusions: . dextrose 5 % and 0.45% NaCl 10 mL/hr (02/27/14 2235)    Principal Problem:   Severe sepsis with acute organ dysfunction Active Problems:   Chronic kidney disease (CKD), stage III (moderate)   Hypoglycemia   AKI (acute kidney injury)   Sepsis secondary to UTI   Severe sepsis   Time spent 35 minutes greater than 50% in counseling and coordination of  care

## 2014-02-28 NOTE — Progress Notes (Signed)
  Echocardiogram 2D Echocardiogram has been performed.  Alexzandrea Normington FRANCES 02/28/2014, 9:35 AM

## 2014-03-01 DIAGNOSIS — E43 Unspecified severe protein-calorie malnutrition: Secondary | ICD-10-CM

## 2014-03-01 LAB — TYPE AND SCREEN
ABO/RH(D): A POS
Antibody Screen: NEGATIVE
UNIT DIVISION: 0
Unit division: 0

## 2014-03-01 LAB — BASIC METABOLIC PANEL
BUN: 48 mg/dL — ABNORMAL HIGH (ref 6–23)
CHLORIDE: 102 meq/L (ref 96–112)
CO2: 19 mEq/L (ref 19–32)
Calcium: 8.3 mg/dL — ABNORMAL LOW (ref 8.4–10.5)
Creatinine, Ser: 1.41 mg/dL — ABNORMAL HIGH (ref 0.50–1.10)
GFR, EST AFRICAN AMERICAN: 36 mL/min — AB (ref >90)
GFR, EST NON AFRICAN AMERICAN: 31 mL/min — AB (ref >90)
Glucose, Bld: 142 mg/dL — ABNORMAL HIGH (ref 70–99)
Potassium: 2.9 mEq/L — CL (ref 3.7–5.3)
Sodium: 140 mEq/L (ref 137–147)

## 2014-03-01 LAB — CBC
HCT: 31 % — ABNORMAL LOW (ref 36.0–46.0)
Hemoglobin: 9.5 g/dL — ABNORMAL LOW (ref 12.0–15.0)
MCH: 24.2 pg — ABNORMAL LOW (ref 26.0–34.0)
MCHC: 30.6 g/dL (ref 30.0–36.0)
MCV: 78.9 fL (ref 78.0–100.0)
PLATELETS: 206 10*3/uL (ref 150–400)
RBC: 3.93 MIL/uL (ref 3.87–5.11)
RDW: 17.8 % — ABNORMAL HIGH (ref 11.5–15.5)
WBC: 11.8 10*3/uL — AB (ref 4.0–10.5)

## 2014-03-01 LAB — VANCOMYCIN, RANDOM: VANCOMYCIN RM: 12.5 ug/mL

## 2014-03-01 LAB — GLUCOSE, CAPILLARY
Glucose-Capillary: 131 mg/dL — ABNORMAL HIGH (ref 70–99)
Glucose-Capillary: 137 mg/dL — ABNORMAL HIGH (ref 70–99)
Glucose-Capillary: 99 mg/dL (ref 70–99)

## 2014-03-01 LAB — MAGNESIUM: MAGNESIUM: 2 mg/dL (ref 1.5–2.5)

## 2014-03-01 LAB — PHOSPHORUS: Phosphorus: 3.6 mg/dL (ref 2.3–4.6)

## 2014-03-01 MED ORDER — ONDANSETRON HCL 4 MG/2ML IJ SOLN
4.0000 mg | Freq: Four times a day (QID) | INTRAMUSCULAR | Status: DC | PRN
Start: 2014-03-01 — End: 2014-03-03
  Administered 2014-03-01: 4 mg via INTRAVENOUS
  Filled 2014-03-01: qty 2

## 2014-03-01 MED ORDER — DEXTROSE-NACL 5-0.45 % IV SOLN
INTRAVENOUS | Status: DC
  Administered 2014-03-01 (×2): via INTRAVENOUS
  Administered 2014-03-02: 10 mL/h via INTRAVENOUS
  Administered 2014-03-02: 40 mL/h via INTRAVENOUS

## 2014-03-01 MED ORDER — POTASSIUM CHLORIDE 10 MEQ/100ML IV SOLN
10.0000 meq | INTRAVENOUS | Status: AC
  Administered 2014-03-01 (×6): 10 meq via INTRAVENOUS
  Filled 2014-03-01 (×6): qty 100

## 2014-03-01 NOTE — Progress Notes (Signed)
Inpatient RN Aditi Rovira  [facility] 5W Room 12-[facility] RN Visit-Karen Alford Highland RN  Related admission to [facility] diagnosis of CHF.  Pt is DNR code. OOF DNR in place in patient's home. Pt seen at bedside, lying supine, eyes closed, pt restless. Daughter Horris Latino at bedside, Horris Latino voiced that she felt her mother may want to be on her right side. Investment banker, corporate, pt repositioned to right side, and cleaned of small BM. After repositioning pt appeared to have increased work of breathing, Engineer, petroleum notified. Pt appeared to settle while awaiting staff RN to come to room. [facility] SW Charyl Bigger in during visit, talking with Horris Latino. Staff RN Kystal in at end of visit to assess pt. Per chart review and Bonnie's report pt continues with poor to no po intake, plan at this time if is for pt to return home at discharge,  to continue hospice services. [facility] to continue to follow through discharge.  Patient's home medication list and transfer summary in place  on shadow chart.   Please call [facility] @ [phone]- with any hospice needs.   Thank you. Tracey Harries, RN  [facility]  Hospice Liaison  [phone])

## 2014-03-01 NOTE — Progress Notes (Signed)
[facility] [place] Work note Patient was resting upon arrival to the room with some labored breathing noticed by daughter, Horris Latino and hospice liaison RN, Santiago Glad. Staff RN, Albina Billet assessed and patient did show agitation, yet fell asleep after nsg staff exited the room. Horris Latino shares ongoing concern with care and ability to manage at home. She shared stress, sadness with her role. We talked about options-home, NF, [facility] and daughter continues to consider options.  Support offered to daughter. Katherina Right, [place]

## 2014-03-01 NOTE — Progress Notes (Signed)
PROGRESS NOTE  Sabrina Joyce WGN:[id number] DOB: 1916/11/22 DOA: 02/24/2014 PCP: Antony Blackbird, MD  Summary: 80 year old woman with multiple medical problems, currently on hospice care for end-stage dementia and multiple medical problems who presented with history of poor oral intake and failure to thrive.  Admitted for severe sepsis secondary to UTI with acute renal failure, hypothermia, hypotension. Her condition gradually improved with empiric antibiotics and has been complicated by ongoing failure to thrive with very poor oral intake and profound hypokalemia. Multiple comorbidities overall appears stable and plan is to transition to oral antibiotics in the next one to 2 days and return home with hospice.  Assessment/Plan: 1. UTI with sepsis, hypothermia, borderline hypotension. Hypotension has resolved. Urine culture growing Staph aureus, note this culture was obtained after starting Rocephin, cannot exclude other organisms. 2. Acute renal failure. Secondary to UTI, sepsis, failure to thrive. Slowly improving with IV fluids and appears to be near baseline. She continues to appear clinically dry. 3. Profound hypokalemia, secondary to poor oral intake. Somewhat improved with repletion. Magnesium normal. 4. Failure to thrive with very poor oral intake 5. Profound hypoglycemia on admission. None for 24 hours. Secondary to poor oral intake complicated by recent initiation of glimepiride.  6. Anemia of chronic disease with acute anemia of critical illness. Hemoglobin stable status post 1 unit packed red blood cells. Aranesp was considered in the past but declined by family. No evidence of ongoing bleeding. 7. Lactic acidosis. Likely secondary to acute renal failure and dehydration. Metabolic acidosis resolved. 8. Bacteremia, 1/2 coag negative Staph. Contaminant. No further evaluation planned.  9. Advanced left shoulder degenerative osteoarthritis, left shoulder film April 2009 demonstrated extensive  sclerosis, joint space narrowing, bone on bone contact of the left shoulder. No malalignment or fracture at that time. However compared to most recent chest x-ray 09/30/2010, suspect progression of degeneration rather than acute fracture. 10. Chronic kidney disease stage III. 11. COPD. appears stable. 12. History of coronary artery disease, systolic congestive heart failure with LVEF 40-45% 2007, now worse by 2-D echocardiogram this hospitalization. Significant wall motion abnormalities again seen. No evidence of ACS on admission. She is not an interventional candidate. Plan medical management. 13. End stage dementia. Stable. 14. Severe protein calorie malnutrition.   Remains hemodynamically stable. Plan change to monotherapy with Levaquin based on culture data available. There is no evidence of staph aureus bacteremia or endocarditis on echocardiogram and conservative management would be recommended with oral therapy.  Replete potassium. BMP in the morning. May be able to saline lock fluids 6/16.  Likely change to oral antibiotics and plan discharge in the next 48 hours.  Discussed in detail with daughter at bedside, prognosis overall is poor, however may survive to discharge and return home with hospice.  Code Status: DNR, under hospice care at home DVT prophylaxis: heparin Family Communication: as above Disposition Plan: pending  Murray Hodgkins, MD  [facility]  Pager [phone] If 7PM-7AM, please contact night-coverage at [email or web address], password [facility] 03/01/2014, 12:44 PM  LOS: 5 days   Consultants:  Palliative medicine  Procedures:  Transfusion one unit packed red blood cells 6/11   2-D echocardiogram: Left ventricular ejection fraction 30-35%. Akinesis of the anteroseptal myocardium. Moderate to severe mitral regurgitation. Severe pulmonary artery hypertension.  Antibiotics:  Ceftriaxone 6/11 >> 6/14  Vancomycin 6/12 >> 6/14  Levaquin 6/15  >>    HPI/Subjective: Ate better at dinner last night per daughter at bedside. Patient appears to be resting comfortably.  Objective: Filed Vitals:   02/28/14  1454 02/28/14 1634 02/28/14 2134 03/01/14 0431  BP: 93/56 100/66 104/68 110/67  Pulse: 98  94 92  Temp: 97.6 F (36.4 C)  97.4 F (36.3 C) 97.7 F (36.5 C)  TempSrc: Axillary  Axillary Axillary  Resp: 17  17 16   Height:      Weight:      SpO2: 96%  100% 100%    Intake/Output Summary (Last 24 hours) at 03/01/14 1244 Last data filed at 03/01/14 0506  Gross per 24 hour  Intake 659.17 ml  Output   1375 ml  Net -715.83 ml     Filed Weights   02/25/14 0502  Weight: 33.4 kg (73 lb 10.1 oz)    Exam:  AF, VSS. No hypoxia Gen. Appears debilitated, calm, nontoxic but chronically ill. Psych. Remains confused. Becomes agitated with much stimulation. Cardiovascular. Regular rate and rhythm. No murmur, rub or gallop. No lower extremity edema. Feet are somewhat dry. Multiple wounds dressed. Respiratory. Clear to auscultation bilaterally. No wheezes, rales or rhonchi. Normal respiratory effort.  Data Reviewed: I/O: Urine output 1.375 L, improved oral intake 420, BM x1 Chemistry: Potassium 2.9. BUN and creatinine slowly trending downwards. Magnesium and phosphorus normal. Heme: Hemoglobin stable, 9.5. ID: Urine culture staph aureus. Blood cultures 1/2 bottles with gram-positive cocci, contaminant.  Scheduled Meds: . aspirin EC  81 mg Oral Daily  . heparin  5,000 Units Subcutaneous 3 times per day  . levofloxacin (LEVAQUIN) IV  500 mg Intravenous Q48H  . potassium chloride  10 mEq Intravenous Q1 Hr x 6  . sodium chloride  3 mL Intravenous Q12H   Continuous Infusions: . dextrose 5 % and 0.45% NaCl 75 mL/hr at 03/01/14 0354    Principal Problem:   Severe sepsis with acute organ dysfunction Active Problems:   Chronic kidney disease (CKD), stage III (moderate)   Hypoglycemia   AKI (acute kidney injury)   Sepsis secondary  to UTI   Severe sepsis   Severe protein-calorie malnutrition   Time spent 20 minutes

## 2014-03-01 NOTE — Progress Notes (Signed)
CRITICAL VALUE ALERT  Critical value received: K+ 2.9  Date of notification:  03/01/14  Time of notification:  0706  Critical value read back:yes  Nurse who received alert:  Amaryllis Dyke, RN  MD notified (1st page):  Sarajane Jews, MD  Time of first page:  [phone]  MD notified (2nd page):  Time of second page:  Responding MD:  Sarajane Jews, MD  Time MD responded:  [phone]

## 2014-03-02 DIAGNOSIS — I509 Heart failure, unspecified: Secondary | ICD-10-CM

## 2014-03-02 LAB — GLUCOSE, CAPILLARY
GLUCOSE-CAPILLARY: 111 mg/dL — AB (ref 70–99)
GLUCOSE-CAPILLARY: 66 mg/dL — AB (ref 70–99)
GLUCOSE-CAPILLARY: 136 mg/dL — AB (ref 70–99)
GLUCOSE-CAPILLARY: 60 mg/dL — AB (ref 70–99)
GLUCOSE-CAPILLARY: 63 mg/dL — AB (ref 70–99)
GLUCOSE-CAPILLARY: 135 mg/dL — AB (ref 70–99)
Glucose-Capillary: 87 mg/dL (ref 70–99)
Glucose-Capillary: 66 mg/dL — ABNORMAL LOW (ref 70–99)
Glucose-Capillary: 142 mg/dL — ABNORMAL HIGH (ref 70–99)

## 2014-03-02 LAB — BASIC METABOLIC PANEL
BUN: 39 mg/dL — ABNORMAL HIGH (ref 6–23)
CALCIUM: 7.9 mg/dL — AB (ref 8.4–10.5)
CO2: 20 meq/L (ref 19–32)
Chloride: 101 mEq/L (ref 96–112)
Creatinine, Ser: 1.35 mg/dL — ABNORMAL HIGH (ref 0.50–1.10)
GFR calc Af Amer: 38 mL/min — ABNORMAL LOW (ref >90)
GFR calc non Af Amer: 32 mL/min — ABNORMAL LOW (ref >90)
GLUCOSE: 309 mg/dL — AB (ref 70–99)
Potassium: 3.7 mEq/L (ref 3.7–5.3)
Sodium: 134 mEq/L — ABNORMAL LOW (ref 137–147)

## 2014-03-02 MED ORDER — DEXTROSE 50 % IV SOLN
INTRAVENOUS | Status: AC
  Filled 2014-03-02: qty 50

## 2014-03-02 MED ORDER — DEXTROSE 50 % IV SOLN
25.0000 mL | Freq: Once | INTRAVENOUS | Status: AC | PRN
  Administered 2014-03-02: 25 mL via INTRAVENOUS

## 2014-03-02 MED ORDER — LORAZEPAM 2 MG/ML IJ SOLN
1.0000 mg | INTRAMUSCULAR | Status: DC | PRN
  Administered 2014-03-03: 1 mg via INTRAVENOUS
  Filled 2014-03-02: qty 1

## 2014-03-02 NOTE — Clinical Social Work Psychosocial (Signed)
Clinical Social Work Department BRIEF PSYCHOSOCIAL ASSESSMENT 03/02/2014  Patient:  Sabrina Joyce, Sabrina Joyce     Account Number:  [id number]     Admit date:  02/24/2014  Clinical Social Worker:  Lovey Newcomer  Date/Time:  03/02/2014 02:30 PM  Referred by:  Physician  Date Referred:  03/02/2014 Referred for  Residential hospice placement   Other Referral:   Interview type:  Family Other interview type:   Patient's daughter interviewed at bedside.    PSYCHOSOCIAL DATA Living Status:  FAMILY Admitted from facility:   Level of care:   Primary support name:  Sabrina Joyce Primary support relationship to patient:  CHILD, ADULT Degree of support available:   Support is good.    CURRENT CONCERNS Current Concerns  Post-Acute Placement   Other Concerns:    SOCIAL WORK ASSESSMENT / PLAN CSW was informed by MD that patient's daughter wants patient to go to [facility]. Patient was to discharge home with hospice services but this changed today. CSW met with patient's daughter at bedside to complete assessment. Patient's daughter Sabrina Joyce would like patient to go to [facility] at discharge. CSW explained that [facility] has stated they do not have availability at this time. CSW explored other options for placement but daughter refuses hospice placement at other facility besides [facility]. Daughter understands that patient will not remain in the hospital waiting for a bed at [facility]. CSW explored the option of patient returning home with daughter and being placed at [facility] from home since this was the original plan. Daughter states that she would be able to do this for a few days (3 at most) but could not do this longer than that. Daughter would be willing to take patient home if she knew patient would have bed at [facility] in the next couple of days. Daughter is aware that patient will discharge tomorrow to either [facility], another residential hospice facility, a SNF with Palliative  services, or home with hospice services.   Assessment/plan status:  Psychosocial Support/Ongoing Assessment of Needs Other assessment/ plan:   Complete FL2, Fax, PASRR, send hospice referrals   Information/referral to community resources:   CSW contact information given to patient's daughter.    PATIENT'S/FAMILY'S RESPONSE TO PLAN OF CARE: Patient's daughter Sabrina Joyce plans for patient to go to [facility] but understands that patient will not remain in the hospital waiting for a bed at [facility]. CSW will followup with patient's daughter with available options in the morning.       Liz Beach MSW, [place], [place], [phone]

## 2014-03-02 NOTE — Progress Notes (Signed)
Inpatient RN Rozina Pointer [facility] 5W  Room 12 -[facility] RN Visit-Karen Alford Highland RN  Related admission to [facility] diagnosis of CHF.  Pt is DNR  code. OOF DNR in place in patient's home. Patient seen at bedside, daughter Horris Latino present. Per Horris Latino and chart review pt is not eating or drinking. Horris Latino expressed her understanding of her mother's poor prognosis and her wish for pt to be able to receive end of life care at [facility]. She feels that the conversation she had with attending Dr. Sloan Leiter this am was helpful with the decision to transition to comfort care. Patient remained asleep through out visit, no nonverbal s/s of pain or discomfort noted.  [facility] will continue to follow through discharge. Writer spoke with CSW Marshell Levan confirming Bonnie's choice for [facility] bed for her mother.  Patient's home medication list and transfer summary in place on shadow chart.   Please call [facility] @ [phone] with any hospice needs.   Thank you. Tracey Harries, RN  [facility]  Hospice Liaison  [phone])

## 2014-03-02 NOTE — Progress Notes (Signed)
PATIENT DETAILS Name: Sabrina Joyce Age: 80 y.o. Sex: female Date of Birth: [birth year] Admit Date: 02/24/2014 Admitting Physician Etta Quill, DO KDT:OIZT, [name], MD  Subjective: Confused. Per Nursing staff-not eating-except for a few bites.  Assessment/Plan: Principal Problem: UTI with sepsis -presented with hypothermia, borderline hypotension.Hypotension has resolved. Urine culture growing Staph aureus, note this culture was obtained after starting Rocephin, cannot exclude other organisms.Currently on Levaquin since 6/15.  -Spoke with Daughter [name] over the phone this am,she is aware of very poor overall prognoses. She is agreeable to start full comfort measures if any further deterioration. She is wanting discharge to [facility] if bed available.  Active Problems: Acute renal failure.  -Secondary to UTI, sepsis, failure to thrive -creatinine significantly improved with hydration, unfortunately, given advanced dementia, failure to thrive syndrome and very poor oral intake, will continue to be at risk for dehydration and worsening renal function.Best served with transition to comfort care measures at this point. Daughter agreeable. Given Chronic systolic CHF-will stop IVF. Difficult situation with no good options at this point.  Profound hypokalemia -secondary to poor oral intake. -K normal today-given poor oral intake/advanced dementia-remains at risk for hypokalemia in the future. Best served with transition to comfort care measures at this point.Will not plan on monitoring electrolytes, given no change in management.  Profound hypoglycemia on admission.  -None for 48  hours. Secondary to poor oral intake complicated by recent initiation of glimepiride.  -stop IVF and monitor-if recurs-will transition to comfort care  Lactic acidosis - Likely secondary to acute renal failure and dehydration. Metabolic acidosis resolved.  Anemia of chronic disease with acute  anemia of critical illness -Hemoglobin stable status post 1 unit packed red blood cells.   Bacteremia, 1/2 coag negative Staph. - Contaminant. No further evaluation planned.   Advanced left shoulder degenerative osteoarthritis, left shoulder film April 2009 demonstrated extensive sclerosis, joint space narrowing, bone on bone contact of the left shoulder.  -No malalignment or fracture at that time. However compared to most recent chest x-ray 09/30/2010, suspect progression of degeneration rather than acute fracture.  Chronic kidney disease stage III. -creatinine close to usual baseline, remains at risk of further worsening of renal function, given very poor oral intake  COPD -stable.   History of coronary artery disease, systolic congestive heart failure with LVEF 40-45% 2007, now worse by 2-D echocardiogram this hospitalization. - Significant wall motion abnormalities again seen. No evidence of ACS on admission. She is not an interventional candidate. Plan medical management.  End stage dementia - Stable.  - Severe protein calorie malnutrition.  Failure to thrive/End Stage Dementia -primary driver of patient's poor prognoses-very poor oral intake, remains at risk for further deterioration. Suspect patient served by comfort care in this setting. Spoke with daughter over the phone, she is ready to transition to residential hospice care.Suspect life expectancy very limited-few weeks-as no significant oral intake  Disposition: Remain inpatient-residential hospice if bed available  DVT Prophylaxis: Prophylactic Heparin   Code Status:  DNR  Family Communication [name]-daughter over the phone  Procedures:  None  CONSULTS:  Palliative care  Time spent 40 minutes-which includes 50% of the time with face-to-face with patient/ family and coordinating care related to the above assessment and plan.    MEDICATIONS: Scheduled Meds: . aspirin EC  81 mg Oral Daily  . heparin   5,000 Units Subcutaneous 3 times per day  . levofloxacin (LEVAQUIN) IV  500 mg Intravenous  Q48H  . sodium chloride  3 mL Intravenous Q12H   Continuous Infusions: . dextrose 5 % and 0.45% NaCl 10 mL/hr (03/02/14 1031)   PRN Meds:.ALPRAZolam, HYDROcodone-acetaminophen, lidocaine, morphine injection, ondansetron (ZOFRAN) IV, sennosides-docusate sodium  Antibiotics: Anti-infectives   Start     Dose/Rate Route Frequency Ordered Stop   03/01/14 1000  levofloxacin (LEVAQUIN) IVPB 500 mg     500 mg 100 mL/hr over 60 Minutes Intravenous Every 48 hours 02/28/14 1226     02/28/14 0930  vancomycin (VANCOCIN) 500 mg in sodium chloride 0.9 % 100 mL IVPB  Status:  Discontinued     500 mg 100 mL/hr over 60 Minutes Intravenous  Once 02/28/14 0850 02/28/14 1200   02/26/14 2015  vancomycin (VANCOCIN) 500 mg in sodium chloride 0.9 % 100 mL IVPB     500 mg 100 mL/hr over 60 Minutes Intravenous NOW 02/26/14 2010 02/26/14 2145   02/26/14 0230  cefTRIAXone (ROCEPHIN) 1 g in dextrose 5 % 50 mL IVPB  Status:  Discontinued     1 g 100 mL/hr over 30 Minutes Intravenous Every 24 hours 02/25/14 0340 02/28/14 1200   02/25/14 0230  cefTRIAXone (ROCEPHIN) 1 g in dextrose 5 % 50 mL IVPB     1 g 100 mL/hr over 30 Minutes Intravenous  Once 02/25/14 0226 02/25/14 0321       PHYSICAL EXAM: Vital signs in last 24 hours: Filed Vitals:   03/01/14 0431 03/01/14 1403 03/01/14 2100 03/02/14 0452  BP: 110/67 93/52 91/53  96/58  Pulse: 92 90 90 82  Temp: 97.7 F (36.5 C) 97.3 F (36.3 C) 97.6 F (36.4 C) 97 F (36.1 C)  TempSrc: Axillary Axillary Axillary Axillary  Resp: 16 18 18 18   Height:      Weight:      SpO2: 100% 96% 99% 100%    Weight change:  Filed Weights   02/25/14 0502  Weight: 33.4 kg (73 lb 10.1 oz)   Body mass index is 14.86 kg/(m^2).   Gen Exam: Awake and pleasantly confused Neck: Supple, No JVD.   Chest: B/L Clear.   CVS: S1 S2 Regular Abdomen: soft, BS +, non tender, non distended.    Extremities: no edema, lower extremities warm to touch. Skin: No Rash.  Wounds: N/A.    Intake/Output from previous day:  Intake/Output Summary (Last 24 hours) at 03/02/14 1049 Last data filed at 03/02/14 0830  Gross per 24 hour  Intake  837.5 ml  Output    650 ml  Net  187.5 ml     LAB RESULTS: CBC  Recent Labs Lab 02/24/14 2359 02/25/14 0918 02/26/14 0300 02/28/14 0550 03/01/14 0602  WBC 15.3* 12.0* 9.6 16.8* 11.8*  HGB 7.1* 6.5* 8.6* 9.5* 9.5*  HCT 24.5* 22.2* 28.4* 31.4* 31.0*  PLT 316 299 245 232 206  MCV 79.3 79.0 78.5 78.9 78.9  MCH 23.0* 23.1* 23.8* 23.9* 24.2*  MCHC 29.0* 29.3* 30.3 30.3 30.6  RDW 17.8* 17.8* 16.7* 17.5* 17.8*  LYMPHSABS 0.4*  --   --   --   --   MONOABS 0.8  --   --   --   --   EOSABS 0.0  --   --   --   --   BASOSABS 0.0  --   --   --   --     Chemistries   Recent Labs Lab 02/25/14 0918 02/26/14 0300 02/28/14 0550 03/01/14 0602 03/02/14 0700  NA 140 141 143 140 134*  K 3.7  3.3* 2.3* 2.9* 3.7  CL 102 102 103 102 101  CO2 17* 19 19 19 20   GLUCOSE 173* 97 64* 142* 309*  BUN 84* 76* 54* 48* 39*  CREATININE 2.39* 2.17* 1.54* 1.41* 1.35*  CALCIUM 7.6* 8.3* 8.1* 8.3* 7.9*  MG  --   --  1.5 2.0  --     CBG:  Recent Labs Lab 03/01/14 0743 03/01/14 1153 03/01/14 1701 03/01/14 2121 03/02/14 0748  GLUCAP 131* 137* 99 87 111*    GFR Estimated Creatinine Clearance: 13.4 ml/min (by C-G formula based on Cr of 1.35).  Coagulation profile No results found for this basename: INR, PROTIME,  in the last 168 hours  Cardiac Enzymes No results found for this basename: CK, CKMB, TROPONINI, MYOGLOBIN,  in the last 168 hours  No components found with this basename: POCBNP,  No results found for this basename: DDIMER,  in the last 72 hours No results found for this basename: HGBA1C,  in the last 72 hours No results found for this basename: CHOL, HDL, LDLCALC, TRIG, CHOLHDL, LDLDIRECT,  in the last 72 hours No results found for this  basename: TSH, T4TOTAL, FREET3, T3FREE, THYROIDAB,  in the last 72 hours No results found for this basename: VITAMINB12, FOLATE, FERRITIN, TIBC, IRON, RETICCTPCT,  in the last 72 hours No results found for this basename: LIPASE, AMYLASE,  in the last 72 hours  Urine Studies No results found for this basename: UACOL, UAPR, USPG, UPH, UTP, UGL, UKET, UBIL, UHGB, UNIT, UROB, ULEU, UEPI, UWBC, URBC, UBAC, CAST, CRYS, UCOM, BILUA,  in the last 72 hours  MICROBIOLOGY: Recent Results (from the past 240 hour(s))  MRSA PCR SCREENING     Status: None   Collection Time    02/25/14  5:24 AM      Result Value Ref Range Status   MRSA by PCR NEGATIVE  NEGATIVE Final   Comment:            The GeneXpert MRSA Assay (FDA     approved for NASAL specimens     only), is one component of a     comprehensive MRSA colonization     surveillance program. It is not     intended to diagnose MRSA     infection nor to guide or     monitor treatment for     MRSA infections.  CULTURE, BLOOD (ROUTINE X 2)     Status: None   Collection Time    02/25/14  9:18 AM      Result Value Ref Range Status   Specimen Description BLOOD LEFT ARM   Final   Special Requests     Final   Value: BOTTLES DRAWN AEROBIC AND ANAEROBIC 10CC BLUE 5CC RED   Culture  Setup Time     Final   Value: 02/25/2014 14:38     Performed at [facility]   Culture     Final   Value:        BLOOD CULTURE RECEIVED NO GROWTH TO DATE CULTURE WILL BE HELD FOR 5 DAYS BEFORE ISSUING A FINAL NEGATIVE REPORT     Performed at [facility]   Report Status PENDING   Incomplete  CULTURE, BLOOD (ROUTINE X 2)     Status: None   Collection Time    02/25/14  9:35 AM      Result Value Ref Range Status   Specimen Description BLOOD LEFT HAND   Final   Special Requests BOTTLES DRAWN  AEROBIC ONLY 8CC   Final   Culture  Setup Time     Final   Value: 02/25/2014 14:44     Performed at [facility]   Culture     Final   Value: STAPHYLOCOCCUS  SPECIES (COAGULASE NEGATIVE)     Note: THE SIGNIFICANCE OF ISOLATING THIS ORGANISM FROM A SINGLE SET OF BLOOD CULTURES WHEN MULTIPLE SETS ARE DRAWN IS UNCERTAIN. PLEASE NOTIFY THE MICROBIOLOGY DEPARTMENT WITHIN ONE WEEK IF SPECIATION AND SENSITIVITIES ARE REQUIRED.     Note: Gram Stain Report Called to,Read Back By and Verified With: LINDA BUN RN EDMOJ     Performed at [facility]   Report Status 02/28/2014 FINAL   Final  URINE CULTURE     Status: None   Collection Time    02/25/14 10:30 AM      Result Value Ref Range Status   Specimen Description URINE, CATHETERIZED   Final   Special Requests NONE   Final   Culture  Setup Time     Final   Value: 02/25/2014 10:54     Performed at [facility]     Final   Value: 35,000 COLONIES/ML     Performed at [facility]   Culture     Final   Value: STAPHYLOCOCCUS AUREUS     Note: RIFAMPIN AND GENTAMICIN SHOULD NOT BE USED AS SINGLE DRUGS FOR TREATMENT OF STAPH INFECTIONS.     Performed at [facility]   Report Status 02/28/2014 FINAL   Final   Organism ID, Bacteria STAPHYLOCOCCUS AUREUS   Final    RADIOLOGY STUDIES/RESULTS: Dg Chest Port 1 View  02/27/2014   CLINICAL DATA:  Hypertension.  Shortness of breath.  EXAM: PORTABLE CHEST - 1 VIEW  COMPARISON:  Chest x-ray 09/30/2010.  FINDINGS: Limited exam due to patient positioning. Cardiomegaly. Mild basilar interstitial prominence and small pleural effusions cannot be excluded. These findings suggest mild congestive heart failure. No pleural effusion or pneumothorax. Deformity noted of proximal left humerus. Fracture cannot be excluded.  IMPRESSION: 1. Cannot exclude mild congestive heart failure. 2. Deformity of the proximal left humerus. Fracture cannot be excluded.   Electronically Signed   By: Marcello Moores  Register   On: 02/27/2014 20:02    Oren Binet, MD  [facility] Pager:[phone]  If 7PM-7AM, please contact  night-coverage [email or web address] Password [email or web address] 03/02/2014, 10:49 AM   LOS: 6 days   **Disclaimer: This note may have been dictated with voice recognition software. Similar sounding words can inadvertently be transcribed and this note may contain transcription errors which may not have been corrected upon publication of note.**

## 2014-03-02 NOTE — Progress Notes (Signed)
Hypoglycemic Event  CBG: 66  Treatment: D50 IV 25 mL  Symptoms: None  Follow-up CBG: Time:1256 CBG Result:136  Possible Reasons for Event: Inadequate meal intake  Comments/MD notified:    Wynetta Emery, Delcine C  Remember to initiate Hypoglycemia Order Set & complete

## 2014-03-02 NOTE — Clinical Social Work Note (Signed)
CSW left message with Katherina Right (hospice social worker) to inform her that the patient's daughter does not want to look at other hospice facilities, and would like to avoid SNF with palliative if possible. Daughter states that she would be comfortable with taking patient home with hospice services if she was assured that patient would have a bed at [facility] in the next 2 to 3 days. CSW updated MD. MD stated that patient will not be discharged today. CSW waits for Melanie's call back.   Liz Beach MSW, [place], [place], [phone]

## 2014-03-02 NOTE — Progress Notes (Signed)
Hypoglycemic Event  CBG: 63  Treatment: D50 IV 25 mL  Symptoms: None  Follow-up CBG: Time:1800 CBG Result:136 Possible Reasons for Event: Inadequate meal intake  Comments    Mefford, Krystal A  Remember to initiate Hypoglycemia Order Set & complete

## 2014-03-02 NOTE — Progress Notes (Signed)
Patient Sabrina Joyce      DOB: 20-Mar-1917      WIO:[id number]  Patient not eating.  Dr. Sloan Leiter shared with me that daughter now agreeable to transition to [facility] place.  [place] team who were aware and are working to make the transition happen when a bed is available. PMT is shadowing with you but no needs at this time.    Lyanna Blystone L. Lovena Le, MD MBA The Palliative Medicine Team at [facility] Phone: [phone] Pager: [phone]

## 2014-03-03 LAB — CULTURE, BLOOD (ROUTINE X 2): CULTURE: NO GROWTH

## 2014-03-03 LAB — GLUCOSE, CAPILLARY: GLUCOSE-CAPILLARY: 59 mg/dL — AB (ref 70–99)

## 2014-03-03 MED ORDER — LORAZEPAM 2 MG/ML PO CONC: 2.0000 mg | ORAL | Status: AC | PRN

## 2014-03-03 MED ORDER — MORPHINE SULFATE (CONCENTRATE) 10 MG /0.5 ML PO SOLN: 5.0000 mg | ORAL | Status: DC | PRN

## 2014-03-03 MED ORDER — ATROPINE SULFATE 1 % OP SOLN
2.0000 [drp] | Freq: Three times a day (TID) | OPHTHALMIC | Status: DC
  Administered 2014-03-03: 2 [drp] via SUBLINGUAL
  Filled 2014-03-03: qty 2

## 2014-03-03 MED ORDER — MORPHINE SULFATE (CONCENTRATE) 10 MG /0.5 ML PO SOLN: 5.0000 mg | ORAL | Status: AC | PRN

## 2014-03-03 MED ORDER — SCOPOLAMINE 1 MG/3DAYS TD PT72: 1.0000 | MEDICATED_PATCH | TRANSDERMAL | Status: AC

## 2014-03-03 MED ORDER — SCOPOLAMINE 1 MG/3DAYS TD PT72
1.0000 | MEDICATED_PATCH | TRANSDERMAL | Status: DC
  Administered 2014-03-03: 1.5 mg via TRANSDERMAL
  Filled 2014-03-03: qty 1

## 2014-03-03 MED ORDER — ACETAMINOPHEN 325 MG PO TABS: 650.0000 mg | ORAL_TABLET | Freq: Four times a day (QID) | ORAL | Status: DC | PRN

## 2014-03-03 MED ORDER — ATROPINE SULFATE 1 % OP SOLN: 2.0000 [drp] | Freq: Three times a day (TID) | OPHTHALMIC | Status: AC

## 2014-03-03 NOTE — Discharge Summary (Signed)
PATIENT DETAILS Name: Sabrina Joyce Age: 80 y.o. Sex: female Date of Birth: 02-15-1917 MRN: [id number]. Admit Date: 02/24/2014 Admitting Physician: Etta Quill, DO IRJ:JOAC, [name], MD  Recommendations for Outpatient Follow-up:  1. Optimize comfort medications  PRIMARY DISCHARGE DIAGNOSIS:  Principal Problem:   Severe sepsis with acute organ dysfunction Active Problems:   Chronic kidney disease (CKD), stage III (moderate)   Hypoglycemia   AKI (acute kidney injury)   Sepsis secondary to UTI   Severe sepsis   Severe protein-calorie malnutrition      PAST MEDICAL HISTORY: Past Medical History  Diagnosis Date  . Chronic kidney disease (CKD), stage III (moderate)   . Anemia   . Hypertension   . Hyperlipidemia   . Osteoporosis   . Pelvic fracture   . COPD (chronic obstructive pulmonary disease)   . CHF (congestive heart failure)   . Colon polyps   . Abdominal aortic aneurysm   . CAD (coronary artery disease) 2007    s/p MI x 1  . Unspecified deficiency anemia 12/01/2013    DISCHARGE MEDICATIONS:   Medication List    STOP taking these medications       ALPRAZolam 0.25 MG tablet  Commonly known as:  XANAX     aspirin 81 MG tablet     furosemide 40 MG tablet  Commonly known as:  LASIX     glimepiride 1 MG tablet  Commonly known as:  AMARYL     haloperidol 2 MG/ML solution  Commonly known as:  HALDOL     HYDROcodone-acetaminophen 5-325 MG per tablet  Commonly known as:  NORCO/VICODIN     isosorbide mononitrate 60 MG 24 hr tablet  Commonly known as:  IMDUR     lidocaine 5 % ointment  Commonly known as:  XYLOCAINE     metoprolol tartrate 25 MG tablet  Commonly known as:  LOPRESSOR     nitroGLYCERIN 0.4 MG SL tablet  Commonly known as:  NITROSTAT     potassium chloride 10 MEQ tablet  Commonly known as:  K-DUR     sennosides-docusate sodium 8.6-50 MG tablet  Commonly known as:  SENOKOT-S      TAKE these medications       atropine 1 %  ophthalmic solution  Place 2 drops under the tongue 3 (three) times daily.     LORazepam 2 MG/ML concentrated solution  Commonly known as:  LORAZEPAM INTENSOL  Take 1 mL (2 mg total) by mouth every 4 (four) hours as needed for anxiety or sedation.     morphine CONCENTRATE 10 mg / 0.5 ml concentrated solution  Take 0.25-0.5 mLs (5-10 mg total) by mouth every 2 (two) hours as needed for moderate pain or anxiety.     scopolamine 1 MG/3DAYS  Commonly known as:  TRANSDERM-SCOP  Place 1 patch (1.5 mg total) onto the skin every 3 (three) days.        ALLERGIES:   Allergies  Allergen Reactions  . Codeine Nausea Only  . Sulfa Antibiotics Nausea Only  . Neosporin [Neomycin-Polymyxin-Gramicidin] Rash    BRIEF HPI:  See H&P, Labs, Consult and Test reports for all details in brief,80 year old woman with multiple medical problems, currently on hospice care for end-stage dementia and multiple medical problems who presented with history of poor oral intake and failure to thrive. Admitted for severe sepsis secondary to UTI with acute renal failure, hypothermia, hypotension.   CONSULTATIONS:   palliative care  PERTINENT RADIOLOGIC STUDIES: Dg Chest [facility] 1 [address]  02/27/2014   CLINICAL DATA:  Hypertension.  Shortness of breath.  EXAM: PORTABLE CHEST - 1 VIEW  COMPARISON:  Chest x-ray 09/30/2010.  FINDINGS: Limited exam due to patient positioning. Cardiomegaly. Mild basilar interstitial prominence and small pleural effusions cannot be excluded. These findings suggest mild congestive heart failure. No pleural effusion or pneumothorax. Deformity noted of proximal left humerus. Fracture cannot be excluded.  IMPRESSION: 1. Cannot exclude mild congestive heart failure. 2. Deformity of the proximal left humerus. Fracture cannot be excluded.   Electronically Signed   By: Marcello Moores  Register   On: 02/27/2014 20:02     PERTINENT LAB RESULTS: CBC:  Recent Labs  03/01/14 0602  WBC 11.8*  HGB 9.5*  HCT  31.0*  PLT 206   CMET CMP     Component Value Date/Time   NA 134* 03/02/2014 0700   NA 142 12/02/2013 1351   K 3.7 03/02/2014 0700   K 3.5 12/02/2013 1351   CL 101 03/02/2014 0700   CL 102 03/04/2013 1314   CO2 20 03/02/2014 0700   CO2 27 12/02/2013 1351   GLUCOSE 309* 03/02/2014 0700   GLUCOSE 198* 12/02/2013 1351   GLUCOSE 97 03/04/2013 1314   BUN 39* 03/02/2014 0700   BUN 31.8* 12/02/2013 1351   CREATININE 1.35* 03/02/2014 0700   CREATININE 1.4* 12/02/2013 1351   CALCIUM 7.9* 03/02/2014 0700   CALCIUM 9.1 12/02/2013 1351   PROT 6.7 02/24/2014 2359   PROT 6.8 03/04/2013 1314   ALBUMIN 2.4* 02/24/2014 2359   ALBUMIN 2.9* 03/04/2013 1314   AST 17 02/24/2014 2359   AST 14 03/04/2013 1314   ALT 16 02/24/2014 2359   ALT <6 Repeated and Verified 03/04/2013 1314   ALKPHOS 82 02/24/2014 2359   ALKPHOS 74 03/04/2013 1314   BILITOT 0.4 02/24/2014 2359   BILITOT 0.42 03/04/2013 1314   GFRNONAA 32* 03/02/2014 0700   GFRAA 38* 03/02/2014 0700    GFR Estimated Creatinine Clearance: 13.4 ml/min (by C-G formula based on Cr of 1.35). No results found for this basename: LIPASE, AMYLASE,  in the last 72 hours No results found for this basename: CKTOTAL, CKMB, CKMBINDEX, TROPONINI,  in the last 72 hours No components found with this basename: POCBNP,  No results found for this basename: DDIMER,  in the last 72 hours No results found for this basename: HGBA1C,  in the last 72 hours No results found for this basename: CHOL, HDL, LDLCALC, TRIG, CHOLHDL, LDLDIRECT,  in the last 72 hours No results found for this basename: TSH, T4TOTAL, FREET3, T3FREE, THYROIDAB,  in the last 72 hours No results found for this basename: VITAMINB12, FOLATE, FERRITIN, TIBC, IRON, RETICCTPCT,  in the last 72 hours Coags: No results found for this basename: PT, INR,  in the last 72 hours Microbiology: Recent Results (from the past 240 hour(s))  MRSA PCR SCREENING     Status: None   Collection Time    02/25/14  5:24 AM      Result  Value Ref Range Status   MRSA by PCR NEGATIVE  NEGATIVE Final   Comment:            The GeneXpert MRSA Assay (FDA     approved for NASAL specimens     only), is one component of a     comprehensive MRSA colonization     surveillance program. It is not     intended to diagnose MRSA     infection nor to guide or  monitor treatment for     MRSA infections.  CULTURE, BLOOD (ROUTINE X 2)     Status: None   Collection Time    02/25/14  9:18 AM      Result Value Ref Range Status   Specimen Description BLOOD LEFT ARM   Final   Special Requests     Final   Value: BOTTLES DRAWN AEROBIC AND ANAEROBIC 10CC BLUE 5CC RED   Culture  Setup Time     Final   Value: 02/25/2014 14:38     Performed at [facility]   Culture     Final   Value: NO GROWTH 5 DAYS     Performed at [facility]   Report Status 03/03/2014 FINAL   Final  CULTURE, BLOOD (ROUTINE X 2)     Status: None   Collection Time    02/25/14  9:35 AM      Result Value Ref Range Status   Specimen Description BLOOD LEFT HAND   Final   Special Requests BOTTLES DRAWN AEROBIC ONLY 8CC   Final   Culture  Setup Time     Final   Value: 02/25/2014 14:44     Performed at [facility]   Culture     Final   Value: STAPHYLOCOCCUS SPECIES (COAGULASE NEGATIVE)     Note: THE SIGNIFICANCE OF ISOLATING THIS ORGANISM FROM A SINGLE SET OF BLOOD CULTURES WHEN MULTIPLE SETS ARE DRAWN IS UNCERTAIN. PLEASE NOTIFY THE MICROBIOLOGY DEPARTMENT WITHIN ONE WEEK IF SPECIATION AND SENSITIVITIES ARE REQUIRED.     Note: Gram Stain Report Called to,Read Back By and Verified With: LINDA BUN RN EDMOJ     Performed at [facility]   Report Status 02/28/2014 FINAL   Final  URINE CULTURE     Status: None   Collection Time    02/25/14 10:30 AM      Result Value Ref Range Status   Specimen Description URINE, CATHETERIZED   Final   Special Requests NONE   Final   Culture  Setup Time     Final   Value: 02/25/2014 10:54     Performed  at [facility]     Final   Value: 35,000 COLONIES/ML     Performed at [facility]   Culture     Final   Value: STAPHYLOCOCCUS AUREUS     Note: RIFAMPIN AND GENTAMICIN SHOULD NOT BE USED AS SINGLE DRUGS FOR TREATMENT OF STAPH INFECTIONS.     Performed at [facility]   Report Status 02/28/2014 FINAL   Final   Organism ID, Bacteria STAPHYLOCOCCUS AUREUS   Final     BRIEF HOSPITAL COURSE:  UTI with sepsis  -presented with hypothermia, borderline hypotension.Hypotension has resolved. Urine culture growing Staph aureus, note this culture was obtained after starting Rocephin, cannot exclude other organisms.Was on Levaquin till 6/17, but given overall poor prognoses, have transitioned to full comfort care on 6/17, all antibiotics have been discontinued at this point. Daughter, [name] was made aware on 6/17,and is agreeable with the plan. Awaiting residential hospice placement.   Active Problems:  Acute renal failure.  -Secondary to UTI, sepsis, failure to thrive  -creatinine significantly improved with hydration, unfortunately, given advanced dementia, failure to thrive syndrome and very poor oral intake, will continue to be at risk for dehydration and worsening renal function.Best served with transition to comfort care measures at this point. Daughter agreeable. Given Chronic systolic CHF-will stop  IVF. Difficult situation with no good options at this point.No further lab work will be done, transitioned to comfort care. Being discharged to [facility]  Profound hypokalemia  -secondary to poor oral intake.  -K normal today-given poor oral intake/advanced dementia-remains at risk for hypokalemia in the future. Best served with transition to comfort care measures at this point.Will not plan on monitoring electrolytes, given no change in management.   Profound hypoglycemia on admission.  Secondary to poor oral intake complicated by recent initiation of  glimepiride. Recurred after stopping IVF on 6/16, has hardly any PO intake, spoke with daughter-[name] on 6/17-explained that this will continue as no oral intake at all, have recommended transition to full comfort measures, will not plan on checking CBG's at this point.Daughter agreeable.   Lactic acidosis  - Likely secondary to acute renal failure and dehydration. Metabolic acidosis resolved.   Anemia of chronic disease with acute anemia of critical illness  -Hemoglobin stable status post 1 unit packed red blood cells. No further lab work will be done, transitioned to comfort care.   Bacteremia, 1/2 coag negative Staph.  - Contaminant. No further evaluation planned.   Advanced left shoulder degenerative osteoarthritis, left shoulder film April 2009 demonstrated extensive sclerosis, joint space narrowing, bone on bone contact of the left shoulder.  -No malalignment or fracture at that time. However compared to most recent chest x-ray 09/30/2010, suspect progression of degeneration rather than acute fracture.   Chronic kidney disease stage III.  -creatinine close to usual baseline, remains at risk of further worsening of renal function, given very poor oral intake  No further lab work will be done, transitioned to comfort care.   COPD  -stable.   History of coronary artery disease, systolic congestive heart failure with LVEF 40-45% 2007, now worse by 2-D echocardiogram this hospitalization.  - Significant wall motion abnormalities again seen. No evidence of ACS on admission. She is not an interventional candidate. Plan medical management.   End stage dementia  - Stable.   - Severe protein calorie malnutrition.   Failure to thrive/End Stage Dementia  -primary driver of patient's poor prognoses-very poor oral intake, remains at risk for further deterioration. Suspect patient served by comfort care in this setting. Spoke with daughter over the phone, she is ready to transition to  residential hospice care.Suspect life expectancy very limited-few weeks-as no significant oral intake  TODAY-DAY OF DISCHARGE:  Subjective:   Keyetta Bloomquist today has remains lethargic.  Objective:   Blood pressure 100/59, pulse 88, temperature 97.5 F (36.4 C), temperature source Axillary, resp. rate 18, height 4\' 11"  (1.499 m), weight 33.4 kg (73 lb 10.1 oz), SpO2 98.00%.  Intake/Output Summary (Last 24 hours) at 03/03/14 1156 Last data filed at 03/03/14 0515  Gross per 24 hour  Intake 1222.83 ml  Output    700 ml  Net 522.83 ml   Filed Weights   02/25/14 0502  Weight: 33.4 kg (73 lb 10.1 oz)    Exam Awake but lethargic [place].AT,PERRAL Supple Neck,No JVD, No cervical lymphadenopathy appriciated.  Symmetrical Chest wall movement, Good air movement bilaterally, CTAB   DISCHARGE CONDITION: Poor prognosis  DISPOSITION: Residential Hospice  DISCHARGE INSTRUCTIONS:    Activity:  As tolerated   Diet recommendation: Comfort feeding  Discharge Instructions   Diet general    Complete by:  As directed   Comfort feeds as tolerated     Increase activity slowly    Complete by:  As directed  Total Time spent on discharge equals 45 minutes.  SignedOren Binet 03/03/2014 11:56 AM  **Disclaimer: This note may have been dictated with voice recognition software. Similar sounding words can inadvertently be transcribed and this note may contain transcription errors which may not have been corrected upon publication of note.**

## 2014-03-03 NOTE — Clinical Social Work Note (Signed)
Hospice referrals sent to [facility], [facility], [facility], and [facility].   Liz Beach MSW, [place], [place], [phone]

## 2014-03-03 NOTE — Progress Notes (Signed)
Hypoglycemic Event  CBG: 66  Treatment: D50 IV 25 mL  Symptoms: None  Follow-up CBG: Time:2258 CBG Result:142  Possible Reasons for Event: Inadequate meal intake  Comments/MD notified:    MOHAMMED, ARIELLE E  Remember to initiate Hypoglycemia Order Set & complete

## 2014-03-03 NOTE — Progress Notes (Signed)
Gavin Pound to be D/C'd Hospice per MD order.  Discussed with the patient and all questions fully answered.    Medication List    STOP taking these medications       ALPRAZolam 0.25 MG tablet  Commonly known as:  XANAX     aspirin 81 MG tablet     furosemide 40 MG tablet  Commonly known as:  LASIX     glimepiride 1 MG tablet  Commonly known as:  AMARYL     haloperidol 2 MG/ML solution  Commonly known as:  HALDOL     HYDROcodone-acetaminophen 5-325 MG per tablet  Commonly known as:  NORCO/VICODIN     isosorbide mononitrate 60 MG 24 hr tablet  Commonly known as:  IMDUR     lidocaine 5 % ointment  Commonly known as:  XYLOCAINE     metoprolol tartrate 25 MG tablet  Commonly known as:  LOPRESSOR     nitroGLYCERIN 0.4 MG SL tablet  Commonly known as:  NITROSTAT     potassium chloride 10 MEQ tablet  Commonly known as:  K-DUR     sennosides-docusate sodium 8.6-50 MG tablet  Commonly known as:  SENOKOT-S      TAKE these medications       atropine 1 % ophthalmic solution  Place 2 drops under the tongue 3 (three) times daily.     LORazepam 2 MG/ML concentrated solution  Commonly known as:  LORAZEPAM INTENSOL  Take 1 mL (2 mg total) by mouth every 4 (four) hours as needed for anxiety or sedation.     morphine CONCENTRATE 10 mg / 0.5 ml concentrated solution  Take 0.25-0.5 mLs (5-10 mg total) by mouth every 2 (two) hours as needed for moderate pain or anxiety.     scopolamine 1 MG/3DAYS  Commonly known as:  TRANSDERM-SCOP  Place 1 patch (1.5 mg total) onto the skin every 3 (three) days.        VVS, Skin clean, dry and intact without evidence of skin break down, no evidence of skin tears noted. IV catheter discontinued intact. Site without signs and symptoms of complications. Dressing and pressure applied.  An After Visit Summary was printed and given to the patient.  D/c education completed with patient/family including follow up instructions, medication list,  d/c activities limitations if indicated, with other d/c instructions as indicated by MD - patient able to verbalize understanding, all questions fully answered.   Patient instructed to return to ED, call 911, or call MD for any changes in condition.   Patient escorted via strecther, and D/C Hospice via EMS.  Audria Nine F 03/03/2014 1:35 PM

## 2014-03-03 NOTE — Progress Notes (Signed)
Pt blood sugar 59, MD aware. Pt to be comfort care. Per MD, no follow-up action to be taken.

## 2014-03-03 NOTE — Clinical Social Work Note (Signed)
Per MD patient ready to DC to [facility]. RN, patient's daughter, and facility aware of DC. RN given number for report. DC packet on chart. AMbulance transport requested for patient. CSW signing off at this time.   Liz Beach MSW, [place], [place], [phone]

## 2014-03-03 NOTE — Plan of Care (Signed)
Problem: Phase III Progression Outcomes Goal: Foley discontinued Outcome: Adequate for Discharge Pt going to be discharged with foley

## 2014-03-03 NOTE — Plan of Care (Signed)
Problem: Phase II Progression Outcomes Goal: Vital signs remain stable Outcome: Adequate for Discharge Pt is comfort care

## 2014-03-03 NOTE — Progress Notes (Signed)
Report called and given to Heather at [facility]. 

## 2014-03-03 NOTE — Progress Notes (Signed)
PATIENT DETAILS Name: Sabrina Joyce Age: 80 y.o. Sex: female Date of Birth: 1917/01/12 Admit Date: 02/24/2014 Admitting Physician Etta Quill, DO TML:YYTK, [name], MD  Subjective: Confused and Lethargic. Hardly any PO intake, CBG's dropping  Assessment/Plan: Principal Problem: UTI with sepsis -presented with hypothermia, borderline hypotension.Hypotension has resolved. Urine culture growing Staph aureus, note this culture was obtained after starting Rocephin, cannot exclude other organisms.Was on Levaquin till 6/17, but given overall poor prognoses, have transitioned to full comfort care on 6/17, all antibiotics have been discontinued at this point. Daughter, [name] was made aware on 6/17,and is agreeable with the plan. Awaiting residential hospice placement.  Active Problems: Acute renal failure.  -Secondary to UTI, sepsis, failure to thrive -creatinine significantly improved with hydration, unfortunately, given advanced dementia, failure to thrive syndrome and very poor oral intake, will continue to be at risk for dehydration and worsening renal function.Best served with transition to comfort care measures at this point. Daughter agreeable. Given Chronic systolic CHF-will stop IVF. Difficult situation with no good options at this point.No further lab work will be done, transitioned to comfort care.  Profound hypokalemia -secondary to poor oral intake. -K normal today-given poor oral intake/advanced dementia-remains at risk for hypokalemia in the future. Best served with transition to comfort care measures at this point.Will not plan on monitoring electrolytes, given no change in management.  Profound hypoglycemia on admission.   Secondary to poor oral intake complicated by recent initiation of glimepiride. Recurred after stopping IVF on 6/16, has hardly any PO intake, spoke with daughter-[name] on 6/17-explained that this will continue as no oral intake at all, have  recommended transition to full comfort measures, will not plan on checking CBG's at this point.Daughter agreeable.  Lactic acidosis - Likely secondary to acute renal failure and dehydration. Metabolic acidosis resolved.  Anemia of chronic disease with acute anemia of critical illness -Hemoglobin stable status post 1 unit packed red blood cells. No further lab work will be done, transitioned to comfort care.  Bacteremia, 1/2 coag negative Staph. - Contaminant. No further evaluation planned.   Advanced left shoulder degenerative osteoarthritis, left shoulder film April 2009 demonstrated extensive sclerosis, joint space narrowing, bone on bone contact of the left shoulder.  -No malalignment or fracture at that time. However compared to most recent chest x-ray 09/30/2010, suspect progression of degeneration rather than acute fracture.  Chronic kidney disease stage III. -creatinine close to usual baseline, remains at risk of further worsening of renal function, given very poor oral intake No further lab work will be done, transitioned to comfort care.  COPD -stable.   History of coronary artery disease, systolic congestive heart failure with LVEF 40-45% 2007, now worse by 2-D echocardiogram this hospitalization. - Significant wall motion abnormalities again seen. No evidence of ACS on admission. She is not an interventional candidate. Plan medical management.  End stage dementia - Stable.  - Severe protein calorie malnutrition.  Failure to thrive/End Stage Dementia -primary driver of patient's poor prognoses-very poor oral intake, remains at risk for further deterioration. Suspect patient served by comfort care in this setting. Spoke with daughter over the phone, she is ready to transition to residential hospice care.Suspect life expectancy very limited-few weeks-as no significant oral intake  Disposition: Remain inpatient-residential hospice if bed available  DVT Prophylaxis: Not  needed as Comfort care  Code Status:  DNR  Family Communication [name]-daughter over the phone 6/17  Procedures:  None  CONSULTS:  Palliative care  Time spent 40 minutes-which includes 50% of the time with face-to-face with patient/ family and coordinating care related to the above assessment and plan.  MEDICATIONS: Scheduled Meds: . atropine  2 drop Sublingual TID  . scopolamine  1 patch Transdermal Q72H  . sodium chloride  3 mL Intravenous Q12H   Continuous Infusions: . dextrose 5 % and 0.45% NaCl 10 mL/hr (03/02/14 1031)   PRN Meds:.acetaminophen, LORazepam, morphine injection, ondansetron (ZOFRAN) IV  Antibiotics: Anti-infectives   Start     Dose/Rate Route Frequency Ordered Stop   03/01/14 1000  levofloxacin (LEVAQUIN) IVPB 500 mg  Status:  Discontinued     500 mg 100 mL/hr over 60 Minutes Intravenous Every 48 hours 02/28/14 1226 03/03/14 0907   02/28/14 0930  vancomycin (VANCOCIN) 500 mg in sodium chloride 0.9 % 100 mL IVPB  Status:  Discontinued     500 mg 100 mL/hr over 60 Minutes Intravenous  Once 02/28/14 0850 02/28/14 1200   02/26/14 2015  vancomycin (VANCOCIN) 500 mg in sodium chloride 0.9 % 100 mL IVPB     500 mg 100 mL/hr over 60 Minutes Intravenous NOW 02/26/14 2010 02/26/14 2145   02/26/14 0230  cefTRIAXone (ROCEPHIN) 1 g in dextrose 5 % 50 mL IVPB  Status:  Discontinued     1 g 100 mL/hr over 30 Minutes Intravenous Every 24 hours 02/25/14 0340 02/28/14 1200   02/25/14 0230  cefTRIAXone (ROCEPHIN) 1 g in dextrose 5 % 50 mL IVPB     1 g 100 mL/hr over 30 Minutes Intravenous  Once 02/25/14 0226 02/25/14 0321       PHYSICAL EXAM: Vital signs in last 24 hours: Filed Vitals:   03/01/14 2100 03/02/14 0452 03/02/14 2042 03/03/14 0448  BP: 91/53 96/58 101/61 100/59  Pulse: 90 82 88   Temp: 97.6 F (36.4 C) 97 F (36.1 C) 97.7 F (36.5 C) 97.5 F (36.4 C)  TempSrc: Axillary Axillary Axillary Axillary  Resp: 18 18 18 18   Height:      Weight:       SpO2: 99% 100% 100% 98%    Weight change:  Filed Weights   02/25/14 0502  Weight: 33.4 kg (73 lb 10.1 oz)   Body mass index is 14.86 kg/(m^2).   Gen Exam: Awake, but very lethargic, pleasantly confused Neck: Supple, No JVD.   Chest: B/L Clear.   CVS: S1 S2 Regular Abdomen: soft, BS +, non tender, non distended.  Extremities: no edema, lower extremities warm to touch. Skin: No Rash.  Wounds: N/A.    Intake/Output from previous day:  Intake/Output Summary (Last 24 hours) at 03/03/14 0944 Last data filed at 03/03/14 0515  Gross per 24 hour  Intake 1222.83 ml  Output    700 ml  Net 522.83 ml     LAB RESULTS: CBC  Recent Labs Lab 02/24/14 2359 02/25/14 0918 02/26/14 0300 02/28/14 0550 03/01/14 0602  WBC 15.3* 12.0* 9.6 16.8* 11.8*  HGB 7.1* 6.5* 8.6* 9.5* 9.5*  HCT 24.5* 22.2* 28.4* 31.4* 31.0*  PLT 316 299 245 232 206  MCV 79.3 79.0 78.5 78.9 78.9  MCH 23.0* 23.1* 23.8* 23.9* 24.2*  MCHC 29.0* 29.3* 30.3 30.3 30.6  RDW 17.8* 17.8* 16.7* 17.5* 17.8*  LYMPHSABS 0.4*  --   --   --   --   MONOABS 0.8  --   --   --   --   EOSABS 0.0  --   --   --   --  BASOSABS 0.0  --   --   --   --     Chemistries   Recent Labs Lab 02/25/14 0918 02/26/14 0300 02/28/14 0550 03/01/14 0602 03/02/14 0700  NA 140 141 143 140 134*  K 3.7 3.3* 2.3* 2.9* 3.7  CL 102 102 103 102 101  CO2 17* 19 19 19 20   GLUCOSE 173* 97 64* 142* 309*  BUN 84* 76* 54* 48* 39*  CREATININE 2.39* 2.17* 1.54* 1.41* 1.35*  CALCIUM 7.6* 8.3* 8.1* 8.3* 7.9*  MG  --   --  1.5 2.0  --     CBG:  Recent Labs Lab 03/02/14 1722 03/02/14 1800 03/02/14 2124 03/02/14 2158 03/03/14 0906  GLUCAP 63* 135* 66* 142* 59*    GFR Estimated Creatinine Clearance: 13.4 ml/min (by C-G formula based on Cr of 1.35).  Coagulation profile No results found for this basename: INR, PROTIME,  in the last 168 hours  Cardiac Enzymes No results found for this basename: CK, CKMB, TROPONINI, MYOGLOBIN,  in the  last 168 hours  No components found with this basename: POCBNP,  No results found for this basename: DDIMER,  in the last 72 hours No results found for this basename: HGBA1C,  in the last 72 hours No results found for this basename: CHOL, HDL, LDLCALC, TRIG, CHOLHDL, LDLDIRECT,  in the last 72 hours No results found for this basename: TSH, T4TOTAL, FREET3, T3FREE, THYROIDAB,  in the last 72 hours No results found for this basename: VITAMINB12, FOLATE, FERRITIN, TIBC, IRON, RETICCTPCT,  in the last 72 hours No results found for this basename: LIPASE, AMYLASE,  in the last 72 hours  Urine Studies No results found for this basename: UACOL, UAPR, USPG, UPH, UTP, UGL, UKET, UBIL, UHGB, UNIT, UROB, ULEU, UEPI, UWBC, URBC, UBAC, CAST, CRYS, UCOM, BILUA,  in the last 72 hours  MICROBIOLOGY: Recent Results (from the past 240 hour(s))  MRSA PCR SCREENING     Status: None   Collection Time    02/25/14  5:24 AM      Result Value Ref Range Status   MRSA by PCR NEGATIVE  NEGATIVE Final   Comment:            The GeneXpert MRSA Assay (FDA     approved for NASAL specimens     only), is one component of a     comprehensive MRSA colonization     surveillance program. It is not     intended to diagnose MRSA     infection nor to guide or     monitor treatment for     MRSA infections.  CULTURE, BLOOD (ROUTINE X 2)     Status: None   Collection Time    02/25/14  9:18 AM      Result Value Ref Range Status   Specimen Description BLOOD LEFT ARM   Final   Special Requests     Final   Value: BOTTLES DRAWN AEROBIC AND ANAEROBIC 10CC BLUE 5CC RED   Culture  Setup Time     Final   Value: 02/25/2014 14:38     Performed at [facility]   Culture     Final   Value: NO GROWTH 5 DAYS     Performed at [facility]   Report Status 03/03/2014 FINAL   Final  CULTURE, BLOOD (ROUTINE X 2)     Status: None   Collection Time    02/25/14  9:35 AM  Result Value Ref Range Status   Specimen  Description BLOOD LEFT HAND   Final   Special Requests BOTTLES DRAWN AEROBIC ONLY 8CC   Final   Culture  Setup Time     Final   Value: 02/25/2014 14:44     Performed at [facility]   Culture     Final   Value: STAPHYLOCOCCUS SPECIES (COAGULASE NEGATIVE)     Note: THE SIGNIFICANCE OF ISOLATING THIS ORGANISM FROM A SINGLE SET OF BLOOD CULTURES WHEN MULTIPLE SETS ARE DRAWN IS UNCERTAIN. PLEASE NOTIFY THE MICROBIOLOGY DEPARTMENT WITHIN ONE WEEK IF SPECIATION AND SENSITIVITIES ARE REQUIRED.     Note: Gram Stain Report Called to,Read Back By and Verified With: LINDA BUN RN EDMOJ     Performed at [facility]   Report Status 02/28/2014 FINAL   Final  URINE CULTURE     Status: None   Collection Time    02/25/14 10:30 AM      Result Value Ref Range Status   Specimen Description URINE, CATHETERIZED   Final   Special Requests NONE   Final   Culture  Setup Time     Final   Value: 02/25/2014 10:54     Performed at [place]     Final   Value: 35,000 COLONIES/ML     Performed at [facility]   Culture     Final   Value: STAPHYLOCOCCUS AUREUS     Note: RIFAMPIN AND GENTAMICIN SHOULD NOT BE USED AS SINGLE DRUGS FOR TREATMENT OF STAPH INFECTIONS.     Performed at [facility]   Report Status 02/28/2014 FINAL   Final   Organism ID, Bacteria STAPHYLOCOCCUS AUREUS   Final    RADIOLOGY STUDIES/RESULTS: Dg Chest Port 1 View  02/27/2014   CLINICAL DATA:  Hypertension.  Shortness of breath.  EXAM: PORTABLE CHEST - 1 VIEW  COMPARISON:  Chest x-ray 09/30/2010.  FINDINGS: Limited exam due to patient positioning. Cardiomegaly. Mild basilar interstitial prominence and small pleural effusions cannot be excluded. These findings suggest mild congestive heart failure. No pleural effusion or pneumothorax. Deformity noted of proximal left humerus. Fracture cannot be excluded.  IMPRESSION: 1. Cannot exclude mild congestive heart failure. 2. Deformity of the  proximal left humerus. Fracture cannot be excluded.   Electronically Signed   By: Marcello Moores  Register   On: 02/27/2014 20:02    Oren Binet, MD  [facility] Pager:[phone]  If 7PM-7AM, please contact night-coverage [email or web address] Password [email or web address] 03/03/2014, 9:44 AM   LOS: 7 days   **Disclaimer: This note may have been dictated with voice recognition software. Similar sounding words can inadvertently be transcribed and this note may contain transcription errors which may not have been corrected upon publication of note.**

## 2014-03-03 NOTE — Progress Notes (Signed)
Inpatient RN visit- Sabrina Joyce [facility] 5W Room 12-[facility] RN Visit-Karen Alford [place] RN  [facility] diagnosis of  Pt is DNR  code.  OOF DNR in place in patient's home.   Upon arrival to patient's room nonemergent transport staff present to transfer pt to [facility] for EOL care. Pt's daughter Horris Latino present. Horris Latino stated that pt had not responded to her since her arrival earlier this am. Emotional support offered. Pt appeared comfortable, eyes closed, extremities relaxed.  Pt transferred to stretcher for transport to [facility].   .   Thank you. Tracey Harries, RN  [facility]  Hospice Liaison  [phone])

## 2014-03-03 NOTE — Plan of Care (Signed)
Problem: Phase II Progression Outcomes Goal: Obtain order to discontinue catheter if appropriate Outcome: Adequate for Discharge Pt D/c with catheter in place/

## 2014-03-09 ENCOUNTER — Telehealth: Payer: Self-pay

## 2014-03-09 NOTE — Telephone Encounter (Signed)
Patient died @ [facility] per Obituary °

## 2014-03-17 DEATH — deceased

## 2015-10-08 IMAGING — US US EXTREM LOW VENOUS*R*
1 series · 14 of 24 positions shown · non-contrast
Comparison: None.

CLINICAL DATA: Right calf pain and swelling

EXAM:
RIGHT LOWER EXTREMITY VENOUS DOPPLER ULTRASOUND
TECHNIQUE: Gray-scale sonography with graded compression, as well as color
Doppler and duplex ultrasound, were performed to evaluate the deep
venous system from the level of the common femoral vein through the
popliteal and proximal calf veins. Spectral Doppler was utilized to
evaluate flow at rest and with distal augmentation maneuvers.

[Series 1: us extrem low venous*right* · 14 of 40 slices shown]
[im 1/40]
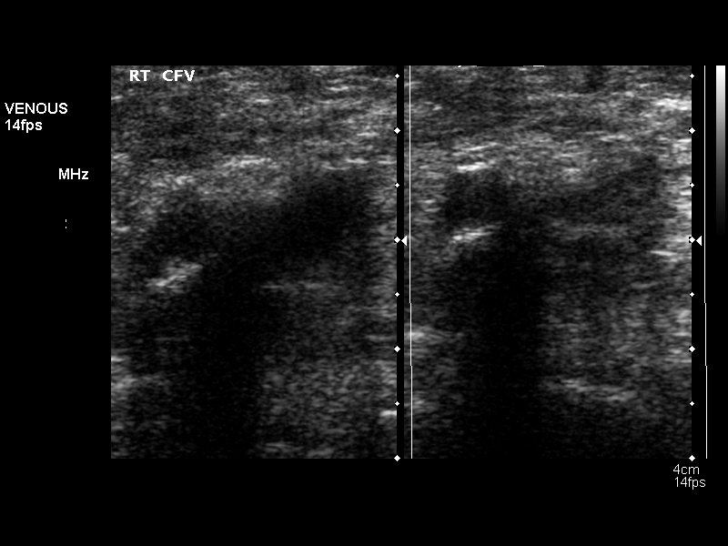
[im 4/40]
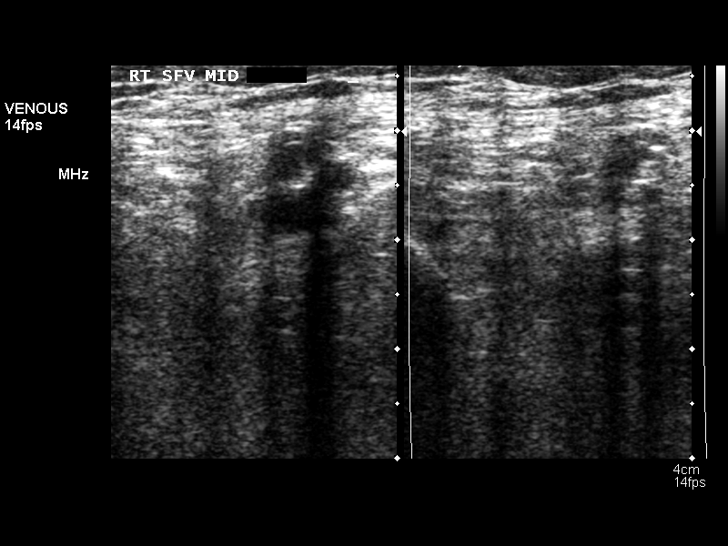
[im 7/40]
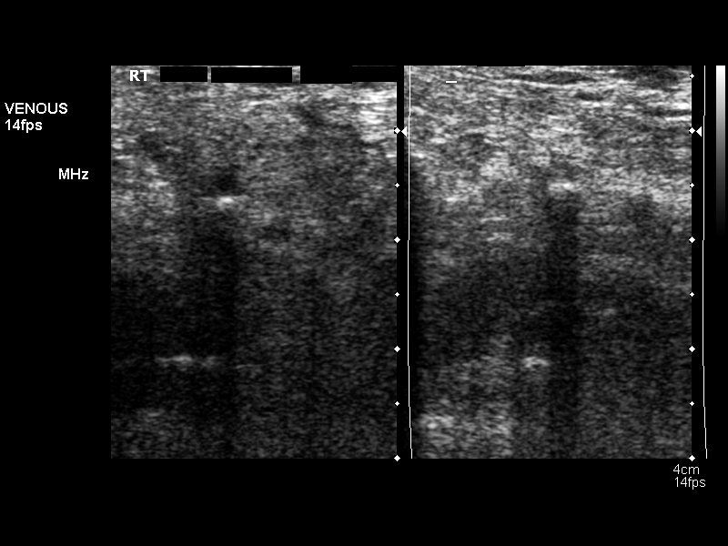
[im 11/40]
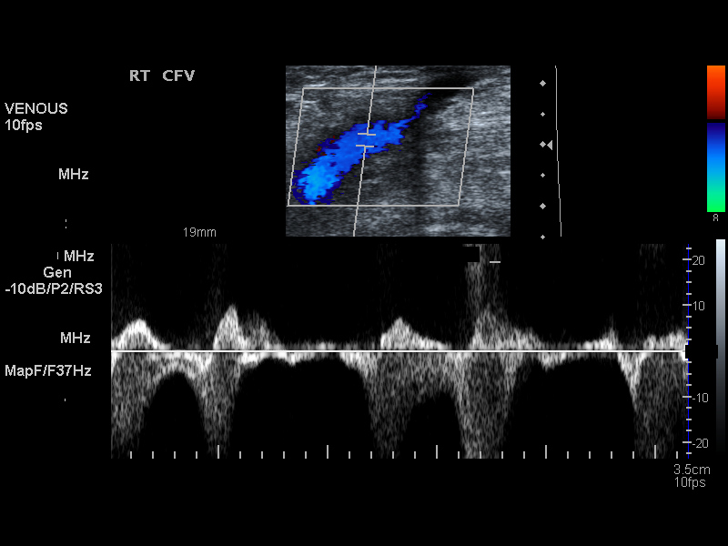
[im 12/40]
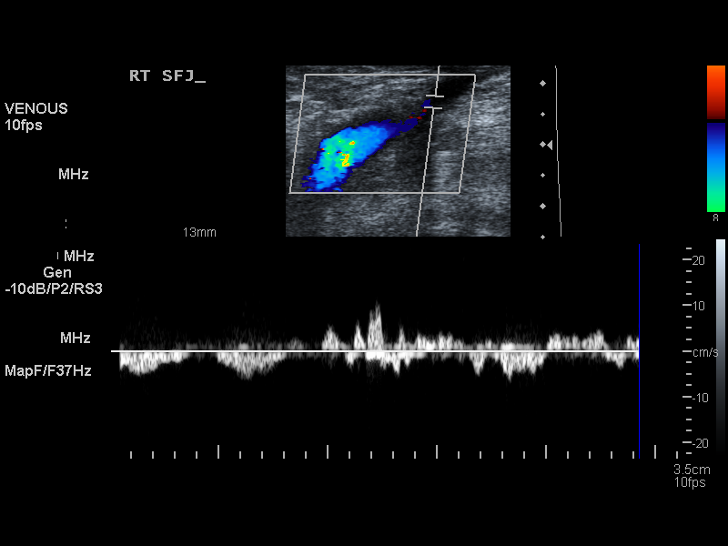
[im 16/40]
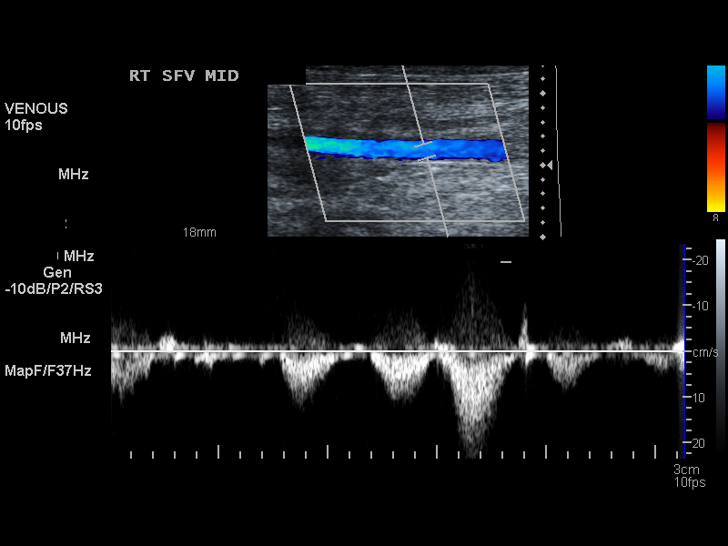
[im 19/40]
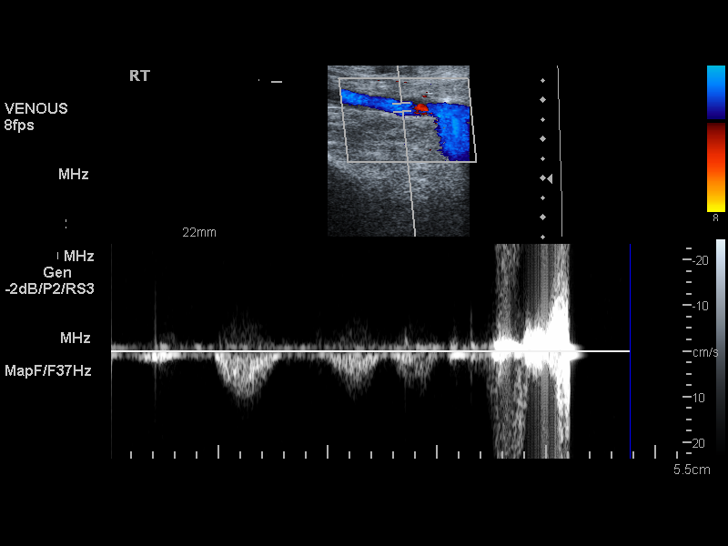
[im 21/40]
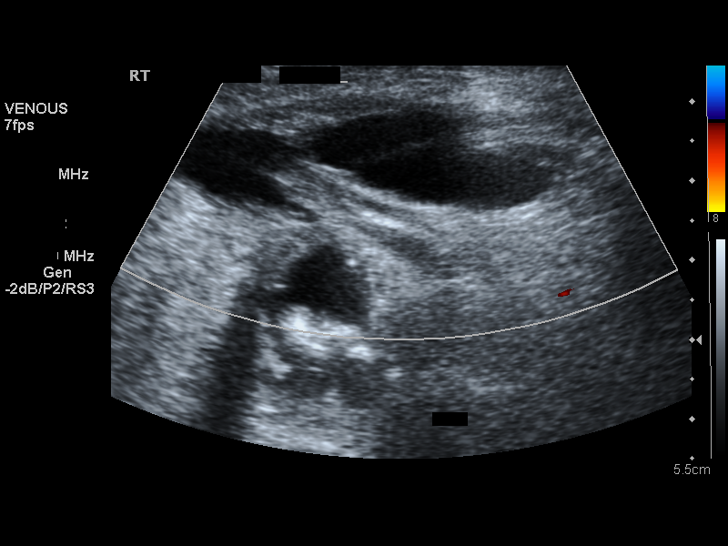
[im 24/40]
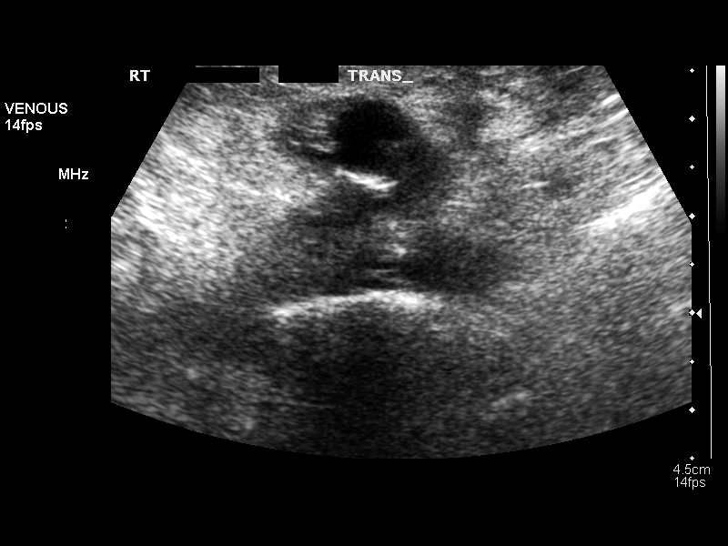
[im 28/40]
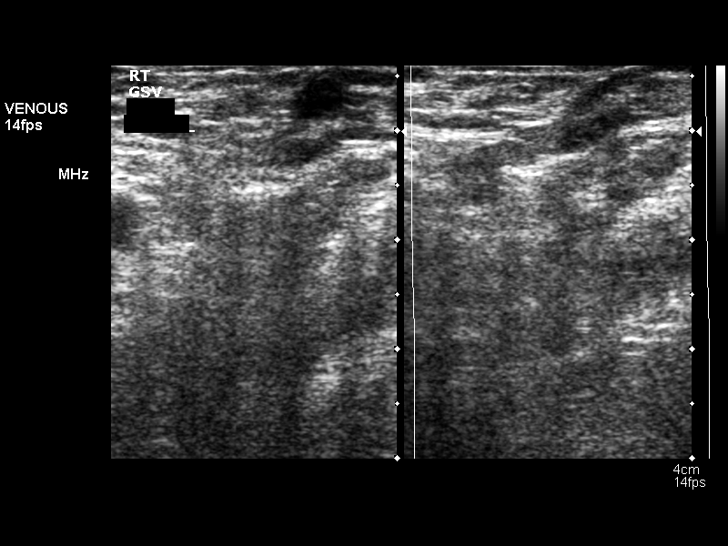
[im 31/40]
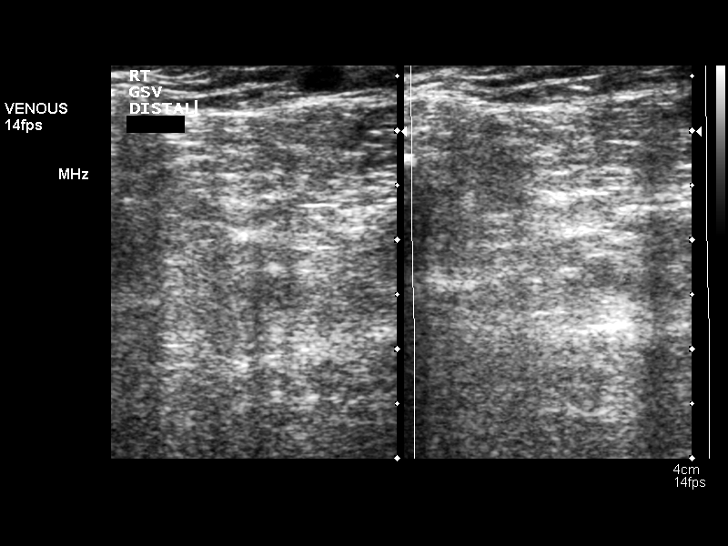
[im 33/40]
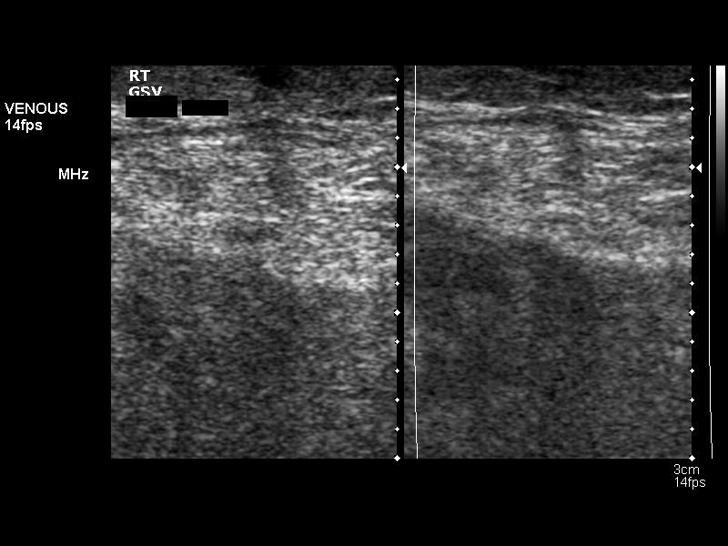
[im 36/40]
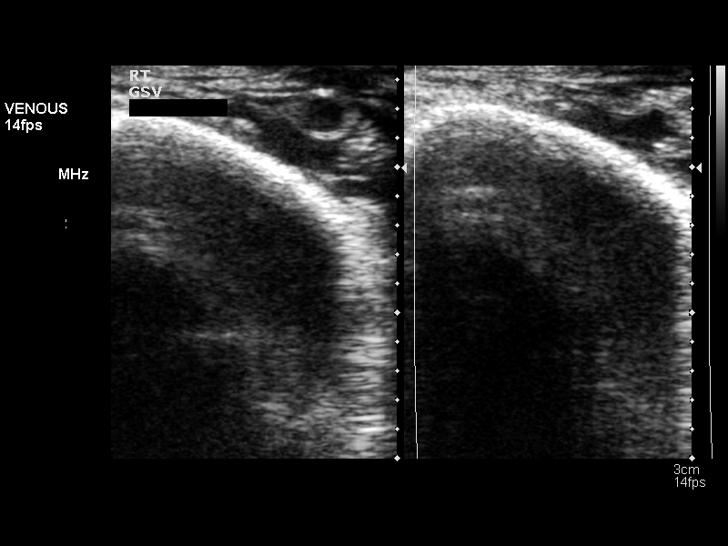
[im 40/40]
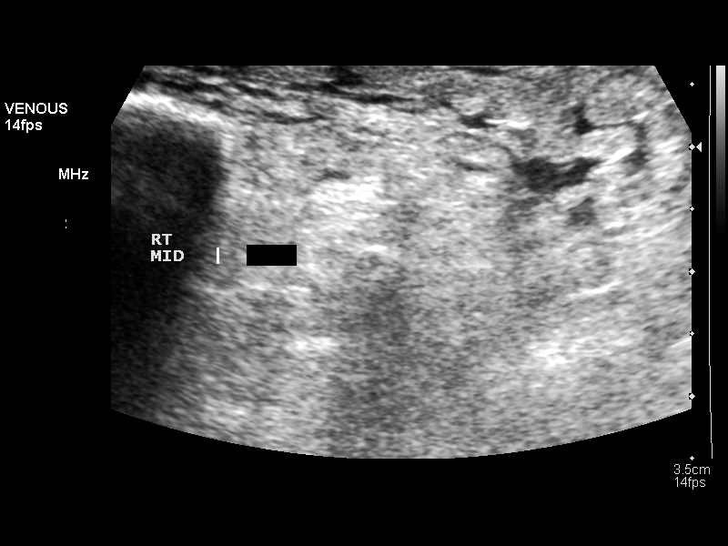

[14 of 24 positions shown; findings below may reference images not displayed]

FINDINGS: Thrombus within deep veins:  None visualized.

Compressibility of deep veins:  Normal.

Duplex waveform respiratory phasicity:  Normal.

Duplex waveform response to augmentation:  Normal.

Venous reflux:  None visualized.

Other findings: There is a serpiginous largely anechoic though
partially septated and minimally complex approximately 5.1 x 1.2 x
2.0 cm fluid collection within the right popliteal fossa favored to
represent a Baker's cyst. Subcutaneous edema is noted within the
calf and lower leg.
IMPRESSION: 1. No evidence of DVT within the right lower extremity.
2. Approximately 5.1 cm minimally complex Baker cyst.
